# Patient Record
Sex: Female | Born: 1975 | Race: Black or African American | Hispanic: No | Marital: Single | Smoking: Never smoker
Health system: Southern US, Community
[De-identification: ages and names within clinical notes are randomized; demographics above are authoritative.]

## PROBLEM LIST (undated history)

## (undated) DIAGNOSIS — J45909 Unspecified asthma, uncomplicated: Secondary | ICD-10-CM

## (undated) DIAGNOSIS — F419 Anxiety disorder, unspecified: Secondary | ICD-10-CM

## (undated) DIAGNOSIS — R55 Syncope and collapse: Secondary | ICD-10-CM

## (undated) DIAGNOSIS — R519 Headache, unspecified: Secondary | ICD-10-CM

## (undated) DIAGNOSIS — D219 Benign neoplasm of connective and other soft tissue, unspecified: Secondary | ICD-10-CM

## (undated) DIAGNOSIS — D649 Anemia, unspecified: Secondary | ICD-10-CM

## (undated) DIAGNOSIS — R112 Nausea with vomiting, unspecified: Secondary | ICD-10-CM

## (undated) DIAGNOSIS — D75839 Thrombocytosis, unspecified: Secondary | ICD-10-CM

## (undated) DIAGNOSIS — F329 Major depressive disorder, single episode, unspecified: Secondary | ICD-10-CM

## (undated) DIAGNOSIS — D473 Essential (hemorrhagic) thrombocythemia: Secondary | ICD-10-CM

## (undated) DIAGNOSIS — Z9889 Other specified postprocedural states: Secondary | ICD-10-CM

## (undated) DIAGNOSIS — R51 Headache: Secondary | ICD-10-CM

## (undated) DIAGNOSIS — Z9289 Personal history of other medical treatment: Secondary | ICD-10-CM

## (undated) DIAGNOSIS — F32A Depression, unspecified: Secondary | ICD-10-CM

## (undated) DIAGNOSIS — G43909 Migraine, unspecified, not intractable, without status migrainosus: Secondary | ICD-10-CM

## (undated) HISTORY — PX: DILATION AND EVACUATION: SHX1459

---

## 2000-02-18 HISTORY — PX: WISDOM TOOTH EXTRACTION: SHX21

## 2003-03-31 ENCOUNTER — Other Ambulatory Visit: Admission: RE | Admit: 2003-03-31 | Discharge: 2003-03-31 | Payer: Self-pay | Admitting: *Deleted

## 2006-04-28 ENCOUNTER — Ambulatory Visit (HOSPITAL_COMMUNITY): Admission: RE | Admit: 2006-04-28 | Discharge: 2006-04-28 | Payer: Self-pay | Admitting: *Deleted

## 2006-08-28 ENCOUNTER — Inpatient Hospital Stay (HOSPITAL_COMMUNITY): Admission: AD | Admit: 2006-08-28 | Discharge: 2006-08-28 | Payer: Self-pay | Admitting: Obstetrics and Gynecology

## 2006-10-13 ENCOUNTER — Inpatient Hospital Stay (HOSPITAL_COMMUNITY): Admission: AD | Admit: 2006-10-13 | Discharge: 2006-10-13 | Payer: Self-pay | Admitting: Obstetrics and Gynecology

## 2006-10-20 ENCOUNTER — Inpatient Hospital Stay (HOSPITAL_COMMUNITY): Admission: AD | Admit: 2006-10-20 | Discharge: 2006-10-20 | Payer: Self-pay | Admitting: Obstetrics and Gynecology

## 2006-10-23 ENCOUNTER — Ambulatory Visit (HOSPITAL_COMMUNITY): Admission: RE | Admit: 2006-10-23 | Discharge: 2006-10-23 | Payer: Self-pay | Admitting: Obstetrics and Gynecology

## 2006-10-23 IMAGING — US US FETAL BPP W/O NONSTRESS
1 series · 14 of 20 positions shown · non-contrast
Comparison: none

OBSTETRICAL ULTRASOUND:

 This ultrasound exam was performed in the [HOSPITAL] Ultrasound Department.  The OB US report was generated in the AS system, and faxed to the ordering physician.  This report is also available in [REDACTED] PACS.

[Series 1: us fetal bpp w/o nonstress · 0.33mm/px · 14 of 20 slices shown]
[im 1/20]
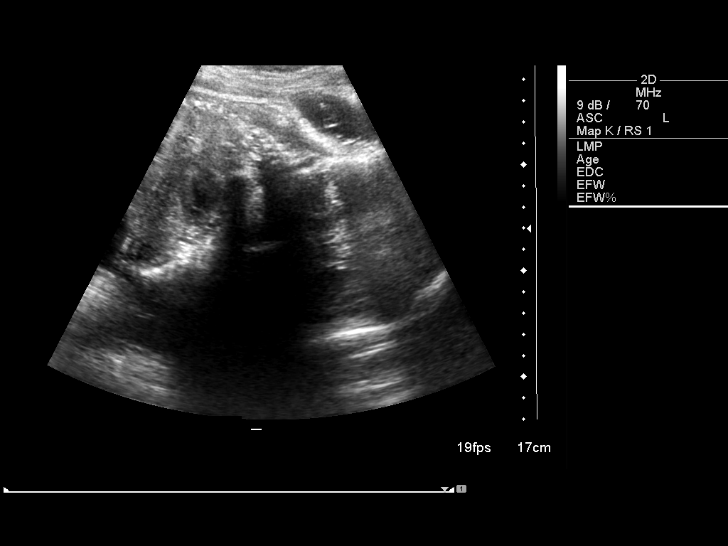
[im 3/20]
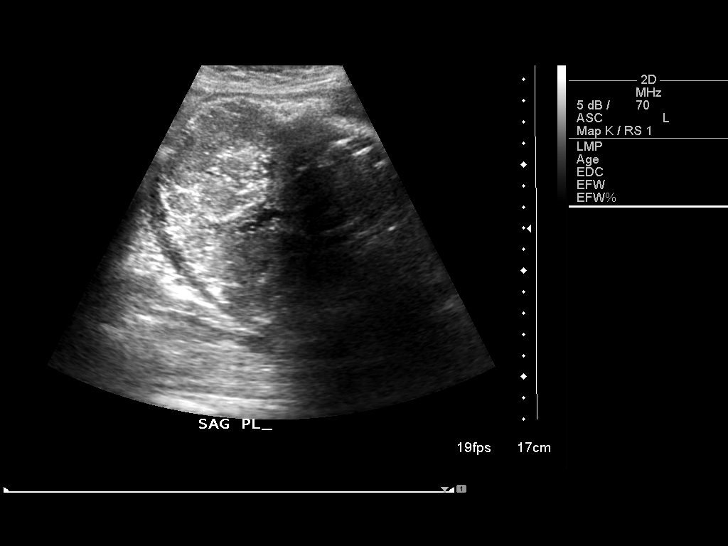
[im 4/20]
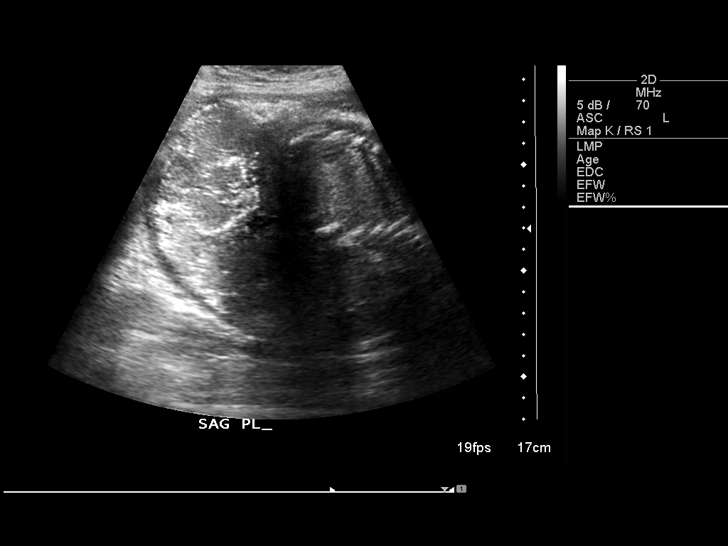
[im 6/20]
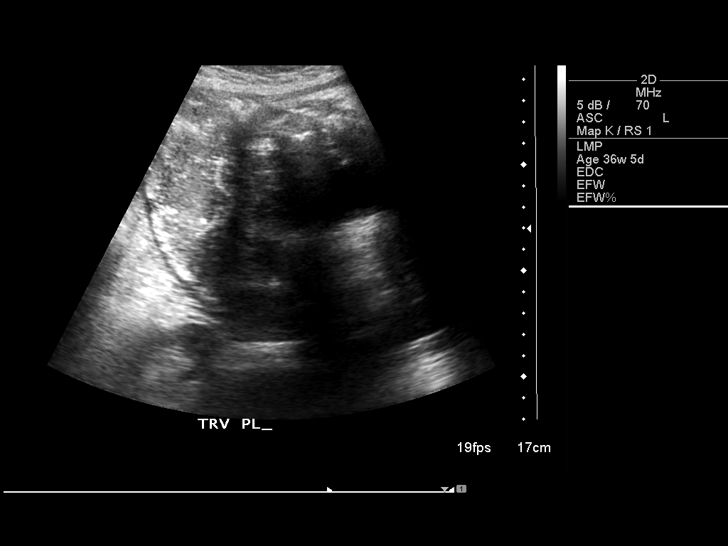
[im 7/20]
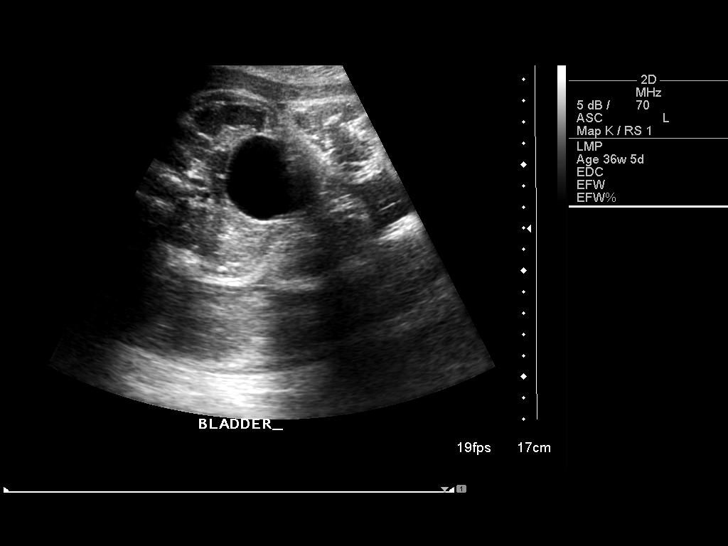
[im 8/20]
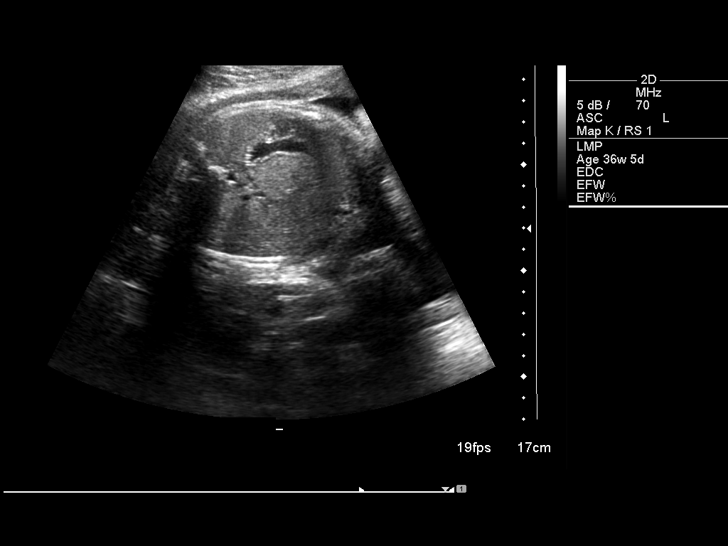
[im 10/20]
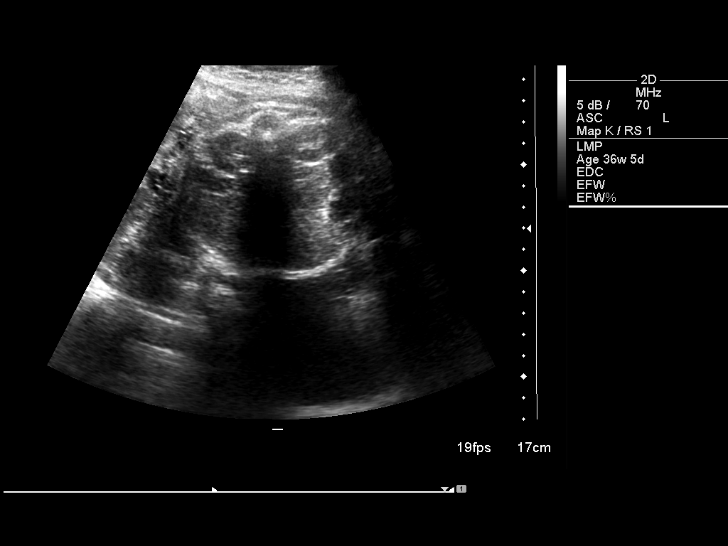
[im 11/20]
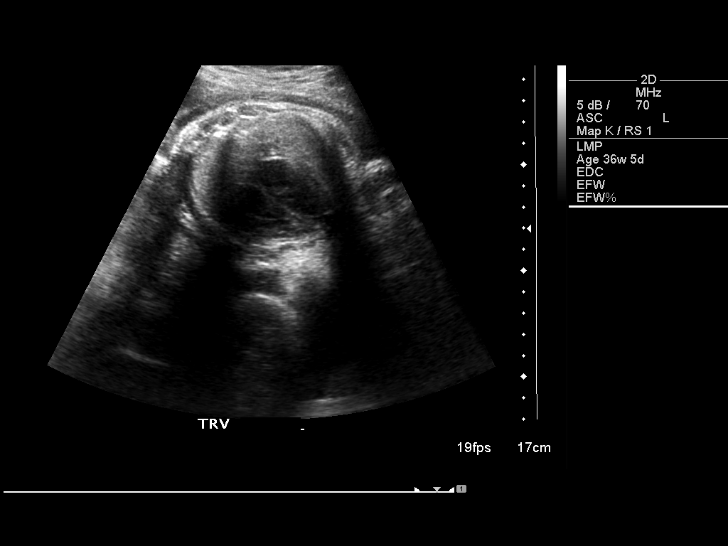
[im 13/20]
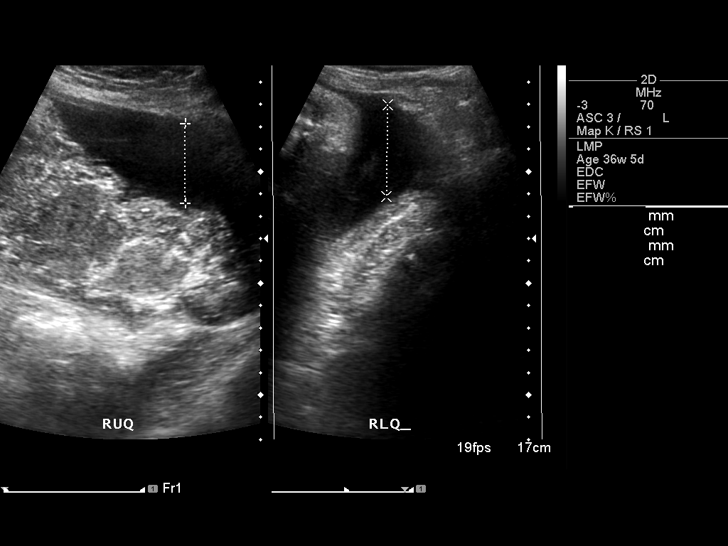
[im 14/20]
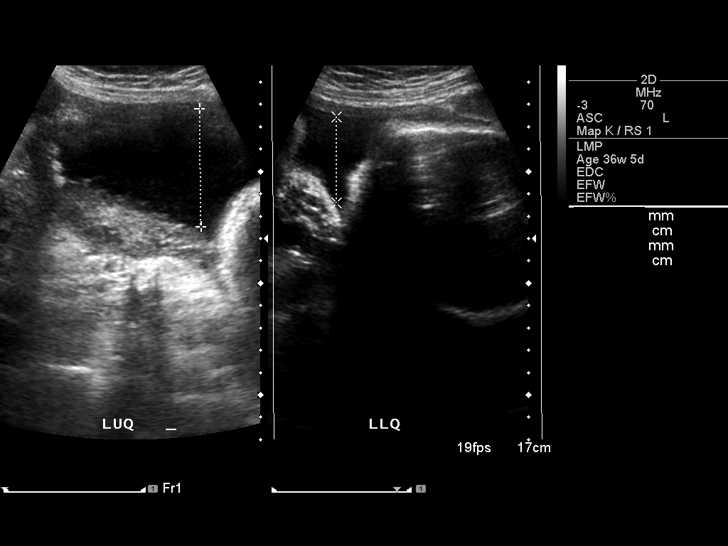
[im 16/20]
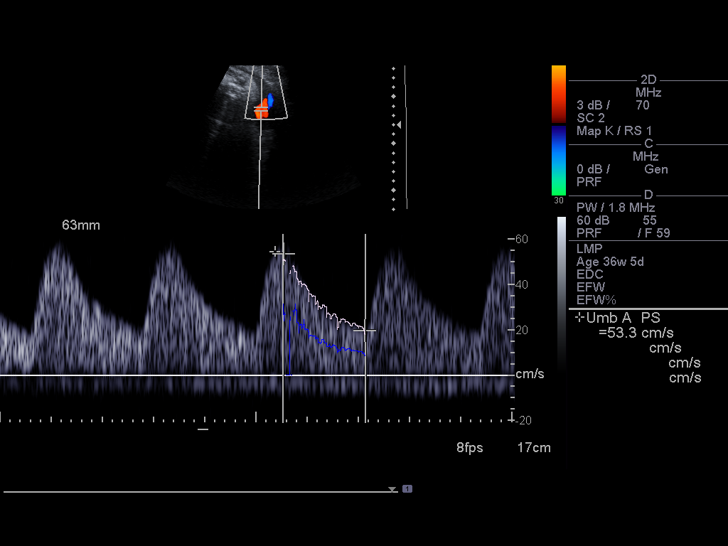
[im 17/20]
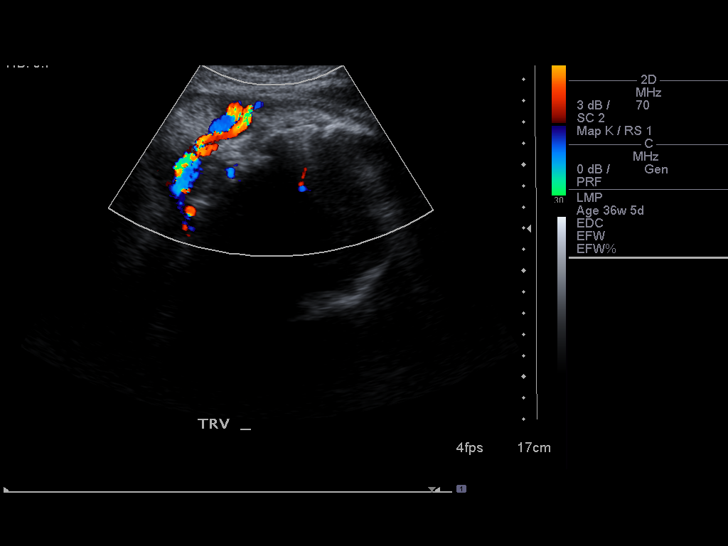
[im 18/20]
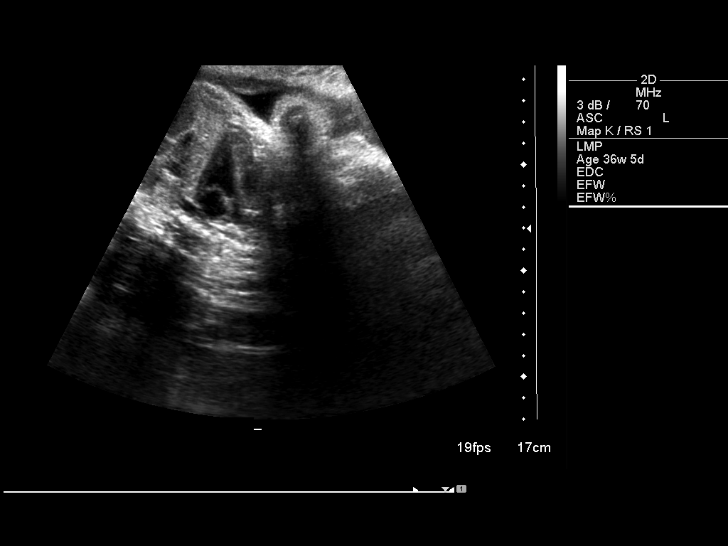
[im 20/20]
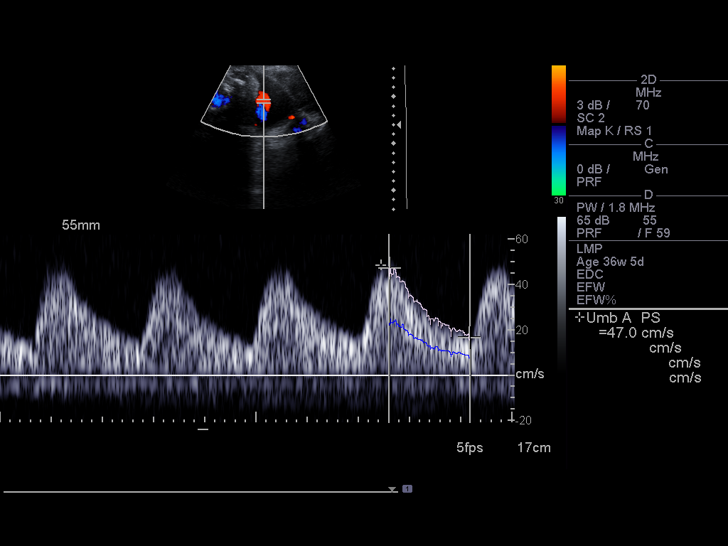

[14 of 20 positions shown; findings below may reference images not displayed]

IMPRESSION: See AS Obstetric US report.

## 2006-10-27 ENCOUNTER — Inpatient Hospital Stay (HOSPITAL_COMMUNITY): Admission: AD | Admit: 2006-10-27 | Discharge: 2006-10-30 | Payer: Self-pay | Admitting: Obstetrics and Gynecology

## 2006-10-27 ENCOUNTER — Ambulatory Visit (HOSPITAL_COMMUNITY): Admission: RE | Admit: 2006-10-27 | Discharge: 2006-10-27 | Payer: Self-pay | Admitting: Obstetrics and Gynecology

## 2010-07-02 NOTE — H&P (Signed)
NAMEMarland Kitchen  Donna Huff, Donna Huff NO.:  [id number]   MEDICAL RECORD NO.:  [id number]          PATIENT TYPE:  INP   LOCATION:  9168                          FACILITY:  [facility]   PHYSICIAN:  Janine Limbo, M.D.DATE OF BIRTH:  20-Sep-1975   DATE OF ADMISSION:  10/27/2006  DATE OF DISCHARGE:                              HISTORY & PHYSICAL   The patient is a 35 year old single black female, gravida 3, para 0-0-2-  0 at 37 weeks, who presents for induction of labor secondary to IUGR.  She had an ultrasound today at [facility] showing estimated fetal  weight less than the 10th percentile at 5 pounds, 2 ounces.  AFI was  normal at 14.5 cm with normal Dopplers.  Her pregnancy has been followed  by [facility] OB[facility]GY[facility] MD service, or initially the CNM service,  then transferred to the MD service and has been remarkable for:  1. Fibroids.  2. First trimester spotting.  3. Asthma.  4. Group B strep negative.  5. Recent abnormal Dopplers that had normalized on 10/23/2006.   Prenatal labs were collected on 11/04/2006.  Hemoglobin 12.1, platelets  282,000.  Blood type A positive, antibody negative.  Sickle cell trait  negative.  RPR non reactive.  Rubella immune.  Hepatitis C surface  antigen negative.  HIV non reactive.  Pap smear within normal limits.  Gonorrhea negative.  Chlamydia negative.  One hour Glucola from  08/19/2006 was 128.   Culture of the vaginal tract for Group B strep, gonorrhea and chlamydia  from 10/16/2006 were all negative.   HISTORY OF PRESENT PREGNANCY:  The patient presented for care at [facility] on 06/04/2006 at 16-2/[redacted] weeks gestation.  Pregnancy  ultrasonography done at 19-2/[redacted] weeks gestation shows growth consistent  with previous dating, confirming EDC of 11/17/2006.  Anterior fibroid  was seen measuring approximately 5 by 2 by 4 cm.  The patient was  treated for UTI at [redacted] weeks gestation.  On 08/28/2006 she had an  ultrasound and  maternity admission showing estimated fetal weight at the  40th percentile, AFI at the 88th percentile, breech presentation.   Ultrasound at [redacted] weeks gestation shows normal AFI with abnormal  Dopplers, growth at the 25th percentile.  Because of the abnormal  Dopplers, plan was for repeat Dopplers and bedrest.  Dopplers were  slightly elevated at 35-1/2 weeks with plan for non stress test once a  week and BPP with Dopplers once a week as well. At 36-1/2 weeks, BPP  was 8 out of 8.  Dopplers were normal.  FD ratio was at the 65th  percentile.  Plan at that point was to do twice weekly NST's and the  patient could come off of bedrest.  Then at the office today the patient  was measuring size less than dates, and ultrasound results from today  are as dictated previously.   PAST OBSTETRICAL HISTORY:  She is gravida 3, para 0-0-2-0, in 15 at [redacted]  weeks gestation she had an elective AB.  In 2001, at 8 to [redacted] weeks  gestation she had an elective AB.  This third pregnancy is a planned  pregnancy.  PAST MEDICAL HISTORY:  She has no medication allergies.  She experienced  menarche at the age of 56 with a 28 day cycle, lasting four days.  She  was diagnosed with fibroids in February 2008.  She had Trichomonas in  2000.  She reports having had the usual childhood illnesses.  The  patient has a history of anemia.  She has had asthma since a baby with  last attack in 1997.  She has had UTI times one.  She has a history of  migraines that do not require medications.   PAST SURGICAL HISTORY:  Remarkable for EAB times two, wisdom teeth  extraction in 2002.   FAMILY HISTORY:  Remarkable for maternal grandmother with heart disease.  Multiple family members with hypertension.  Cousin with varicosities.  Father with anemia.  Multiple family members with asthma and emphysema.  Mother and maternal grandmother with diabetes.  Mother with  fibromyalgia, father with lung cancer.  Maternal uncle with lung  cancer.  Cousin with breast cancer.   GENETIC HISTORY:  The patient's cousin has sickle cell trait.   SOCIAL HISTORY:  The patient is single.  The father of the baby is named  Christiane Ha.  The patient is of the [place] faith.  She has some college  education, is a Press photographer.  The father of the baby has some  college education and is employed in Set [employer].  They denied any  alcohol, tobacco or illicit drug use with the pregnancy.   OBJECTIVE DATA:  Vital signs are stable.  She is afebrile.  HEENT is  grossly within normal limits.  Chest is clear to auscultation.  Heart is  regular rate and rhythm.  Abdomen is gravid in contour with fundal head  extending approximately 35 cm above the pubic symphysis.  Fetal heart  rate is reactive and reassuring.  Contractures are irregular and mild,  every 3 to 8 minutes.  Cervix is fingertip, thick and vertex minus 3.  Extremities are normal.   ASSESSMENT:  1. Intrauterine pregnancy at term.  2. Intrauterine growth retardation.  3. Unfavorable cervix.   PLAN:  1. Admit to a birthing suite.  2. Routine MD orders.  3. Reviewed risks and benefits of induction of labor with the patient.      She agrees to proceed.  4. The plan per Dr. Stefano Gaul, is to use Cytotec tonight, followed by      pitocin in the morning.      Cam Hai, C.N.M.      Janine Limbo, M.D.  Electronically Signed    KS/MEDQ  D:  10/27/2006  T:  10/28/2006  Job:  [id number]

## 2010-11-29 LAB — CBC
HCT: 33.5 — ABNORMAL LOW
Hemoglobin: 11.3 — ABNORMAL LOW
Hemoglobin: 9.3 — ABNORMAL LOW
MCHC: 34.1
MCHC: 34.4
MCHC: 34.5
MCV: 81.4
Platelets: 205
RBC: 3.29 — ABNORMAL LOW
RDW: 14.7 — ABNORMAL HIGH
RDW: 15 — ABNORMAL HIGH

## 2010-11-29 LAB — COMPREHENSIVE METABOLIC PANEL
ALT: 13
Calcium: 8.7
GFR calc Af Amer: >60
Glucose, Bld: 85
Sodium: 134 — ABNORMAL LOW
Total Protein: 5.7 — ABNORMAL LOW

## 2010-11-29 LAB — LACTATE DEHYDROGENASE: LDH: 88 — ABNORMAL LOW

## 2010-12-03 LAB — GC/CHLAMYDIA PROBE AMP, GENITAL
Chlamydia, DNA Probe: NEGATIVE
GC Probe Amp, Genital: NEGATIVE

## 2010-12-03 LAB — URINALYSIS, ROUTINE W REFLEX MICROSCOPIC
Bilirubin Urine: NEGATIVE
Glucose, UA: NEGATIVE
Hgb urine dipstick: NEGATIVE
Ketones, ur: NEGATIVE
Nitrite: NEGATIVE
Protein, ur: NEGATIVE
Specific Gravity, Urine: 1.01
Urobilinogen, UA: 0.2
pH: 6

## 2010-12-03 LAB — URINE MICROSCOPIC-ADD ON: RBC / HPF: NONE SEEN

## 2010-12-03 LAB — STREP B DNA PROBE: Strep Group B Ag: NEGATIVE

## 2010-12-03 LAB — CBC
MCHC: 33
MCV: 83.2
Platelets: 212
RDW: 14.6 — ABNORMAL HIGH

## 2010-12-03 LAB — WET PREP, GENITAL
Clue Cells Wet Prep HPF POC: NONE SEEN
Trich, Wet Prep: NONE SEEN

## 2010-12-03 LAB — DIFFERENTIAL
Basophils Absolute: 0
Basophils Relative: 0
Eosinophils Absolute: 0.1
Neutrophils Relative %: 78 — ABNORMAL HIGH

## 2012-09-17 HISTORY — PX: LAPAROSCOPIC CHOLECYSTECTOMY: SUR755

## 2016-05-26 ENCOUNTER — Observation Stay (HOSPITAL_COMMUNITY)
Admission: EM | Admit: 2016-05-26 | Discharge: 2016-05-27 | Disposition: A | Discharge status: Home / Self Care | Payer: Medicaid Other | Attending: Internal Medicine | Admitting: Internal Medicine

## 2016-05-26 ENCOUNTER — Encounter (HOSPITAL_COMMUNITY): Payer: Self-pay | Admitting: Emergency Medicine

## 2016-05-26 ENCOUNTER — Observation Stay (HOSPITAL_COMMUNITY): Payer: Medicaid Other

## 2016-05-26 DIAGNOSIS — S00212A Abrasion of left eyelid and periocular area, initial encounter: Secondary | ICD-10-CM | POA: Insufficient documentation

## 2016-05-26 DIAGNOSIS — S0181XA Laceration without foreign body of other part of head, initial encounter: Secondary | ICD-10-CM | POA: Insufficient documentation

## 2016-05-26 DIAGNOSIS — R55 Syncope and collapse: Principal | ICD-10-CM | POA: Insufficient documentation

## 2016-05-26 DIAGNOSIS — N920 Excessive and frequent menstruation with regular cycle: Secondary | ICD-10-CM | POA: Diagnosis not present

## 2016-05-26 DIAGNOSIS — D473 Essential (hemorrhagic) thrombocythemia: Secondary | ICD-10-CM | POA: Insufficient documentation

## 2016-05-26 DIAGNOSIS — D509 Iron deficiency anemia, unspecified: Secondary | ICD-10-CM | POA: Insufficient documentation

## 2016-05-26 DIAGNOSIS — D259 Leiomyoma of uterus, unspecified: Secondary | ICD-10-CM | POA: Insufficient documentation

## 2016-05-26 DIAGNOSIS — R4020 Unspecified coma: Secondary | ICD-10-CM | POA: Insufficient documentation

## 2016-05-26 DIAGNOSIS — W1839XA Other fall on same level, initial encounter: Secondary | ICD-10-CM | POA: Insufficient documentation

## 2016-05-26 DIAGNOSIS — Y92002 Bathroom of unspecified non-institutional (private) residence single-family (private) house as the place of occurrence of the external cause: Secondary | ICD-10-CM | POA: Insufficient documentation

## 2016-05-26 DIAGNOSIS — D75839 Thrombocytosis, unspecified: Secondary | ICD-10-CM | POA: Diagnosis present

## 2016-05-26 DIAGNOSIS — D649 Anemia, unspecified: Secondary | ICD-10-CM | POA: Diagnosis not present

## 2016-05-26 DIAGNOSIS — R000 Tachycardia, unspecified: Secondary | ICD-10-CM | POA: Diagnosis not present

## 2016-05-26 DIAGNOSIS — Z9289 Personal history of other medical treatment: Secondary | ICD-10-CM

## 2016-05-26 DIAGNOSIS — Y93E1 Activity, personal bathing and showering: Secondary | ICD-10-CM | POA: Insufficient documentation

## 2016-05-26 DIAGNOSIS — D219 Benign neoplasm of connective and other soft tissue, unspecified: Secondary | ICD-10-CM | POA: Diagnosis present

## 2016-05-26 HISTORY — DX: Major depressive disorder, single episode, unspecified: F32.9

## 2016-05-26 HISTORY — DX: Thrombocytosis, unspecified: D75.839

## 2016-05-26 HISTORY — DX: Other specified postprocedural states: Z98.890

## 2016-05-26 HISTORY — DX: Headache: R51

## 2016-05-26 HISTORY — DX: Migraine, unspecified, not intractable, without status migrainosus: G43.909

## 2016-05-26 HISTORY — DX: Syncope and collapse: R55

## 2016-05-26 HISTORY — DX: Benign neoplasm of connective and other soft tissue, unspecified: D21.9

## 2016-05-26 HISTORY — DX: Unspecified asthma, uncomplicated: J45.909

## 2016-05-26 HISTORY — DX: Anxiety disorder, unspecified: F41.9

## 2016-05-26 HISTORY — DX: Nausea with vomiting, unspecified: R11.2

## 2016-05-26 HISTORY — DX: Depression, unspecified: F32.A

## 2016-05-26 HISTORY — DX: Headache, unspecified: R51.9

## 2016-05-26 HISTORY — DX: Anemia, unspecified: D64.9

## 2016-05-26 HISTORY — DX: Personal history of other medical treatment: Z92.89

## 2016-05-26 HISTORY — DX: Essential (hemorrhagic) thrombocythemia: D47.3

## 2016-05-26 LAB — I-STAT BETA HCG BLOOD, ED (MC, WL, AP ONLY): I-stat hCG, quantitative: <5 m[IU]/mL (ref <5)

## 2016-05-26 LAB — URINALYSIS, ROUTINE W REFLEX MICROSCOPIC
BILIRUBIN URINE: NEGATIVE
Bacteria, UA: NONE SEEN
Glucose, UA: NEGATIVE mg/dL
KETONES UR: 5 mg/dL — AB
Leukocytes, UA: NEGATIVE
Nitrite: NEGATIVE
PH: 5 (ref 5.0–8.0)
Protein, ur: NEGATIVE mg/dL
SPECIFIC GRAVITY, URINE: 1.014 (ref 1.005–1.030)

## 2016-05-26 LAB — CBC
HCT: 24.5 % — ABNORMAL LOW (ref 36.0–46.0)
HCT: 25.3 % — ABNORMAL LOW (ref 36.0–46.0)
Hemoglobin: 6.9 g/dL — CL (ref 12.0–15.0)
Hemoglobin: 7.3 g/dL — ABNORMAL LOW (ref 12.0–15.0)
MCH: 17.5 pg — ABNORMAL LOW (ref 26.0–34.0)
MCH: 18.9 pg — ABNORMAL LOW (ref 26.0–34.0)
MCHC: 28.2 g/dL — ABNORMAL LOW (ref 30.0–36.0)
MCHC: 28.9 g/dL — AB (ref 30.0–36.0)
MCV: 62 fL — ABNORMAL LOW (ref 78.0–100.0)
MCV: 65.5 fL — ABNORMAL LOW (ref 78.0–100.0)
Platelets: 532 10*3/uL — ABNORMAL HIGH (ref 150–400)
Platelets: 469 10*3/uL — ABNORMAL HIGH (ref 150–400)
RBC: 3.95 MIL/uL (ref 3.87–5.11)
RBC: 3.86 MIL/uL — ABNORMAL LOW (ref 3.87–5.11)
RDW: 19.8 % — ABNORMAL HIGH (ref 11.5–15.5)
RDW: 22.5 % — AB (ref 11.5–15.5)
WBC: 9.5 10*3/uL (ref 4.0–10.5)
WBC: 8.5 10*3/uL (ref 4.0–10.5)

## 2016-05-26 LAB — PREPARE RBC (CROSSMATCH)

## 2016-05-26 LAB — IRON AND TIBC
Iron: 9 ug/dL — ABNORMAL LOW (ref 28–170)
SATURATION RATIOS: 2 % — AB (ref 10.4–31.8)
TIBC: 487 ug/dL — ABNORMAL HIGH (ref 250–450)
UIBC: 478 ug/dL

## 2016-05-26 LAB — FOLATE: FOLATE: 7.7 ng/mL (ref >5.9)

## 2016-05-26 LAB — BASIC METABOLIC PANEL
Anion gap: 7 (ref 5–15)
BUN: 9 mg/dL (ref 6–20)
CO2: 25 mmol/L (ref 22–32)
Calcium: 9.4 mg/dL (ref 8.9–10.3)
Chloride: 103 mmol/L (ref 101–111)
Creatinine, Ser: 0.74 mg/dL (ref 0.44–1.00)
GFR calc Af Amer: >60 mL/min (ref >60)
GFR calc non Af Amer: >60 mL/min (ref >60)
Glucose, Bld: 85 mg/dL (ref 65–99)
Potassium: 3.6 mmol/L (ref 3.5–5.1)
Sodium: 135 mmol/L (ref 135–145)

## 2016-05-26 LAB — FERRITIN: FERRITIN: 2 ng/mL — AB (ref 11–307)

## 2016-05-26 LAB — VITAMIN B12: Vitamin B-12: 451 pg/mL (ref 180–914)

## 2016-05-26 LAB — ABO/RH: ABO/RH(D): A POS

## 2016-05-26 LAB — RETICULOCYTES
RBC.: 3.89 MIL/uL (ref 3.87–5.11)
RETIC CT PCT: 1.2 % (ref 0.4–3.1)
Retic Count, Absolute: 46.7 10*3/uL (ref 19.0–186.0)

## 2016-05-26 LAB — I-STAT TROPONIN, ED: Troponin i, poc: 0 ng/mL (ref 0.00–0.08)

## 2016-05-26 LAB — TSH: TSH: 1.726 u[IU]/mL (ref 0.350–4.500)

## 2016-05-26 MED ORDER — HYDROCODONE-ACETAMINOPHEN 5-325 MG PO TABS
1.0000 | ORAL_TABLET | ORAL | Status: DC | PRN
Start: 2016-05-26 — End: 2016-05-27

## 2016-05-26 MED ORDER — TETANUS-DIPHTH-ACELL PERTUSSIS 5-2.5-18.5 LF-MCG/0.5 IM SUSP
0.5000 mL | Freq: Once | INTRAMUSCULAR | Status: AC
  Administered 2016-05-26: 0.5 mL via INTRAMUSCULAR
  Filled 2016-05-26: qty 0.5

## 2016-05-26 MED ORDER — LIDOCAINE HCL 2 % EX GEL
1.0000 "application " | Freq: Four times a day (QID) | CUTANEOUS | Status: DC | PRN
  Filled 2016-05-26: qty 5

## 2016-05-26 MED ORDER — DICLOFENAC SODIUM 1 % TD GEL
4.0000 g | Freq: Four times a day (QID) | TRANSDERMAL | Status: DC | PRN
  Administered 2016-05-26 (×2): 4 g via TOPICAL
  Filled 2016-05-26: qty 100

## 2016-05-26 MED ORDER — SODIUM CHLORIDE 0.9% FLUSH
3.0000 mL | Freq: Two times a day (BID) | INTRAVENOUS | Status: DC
  Administered 2016-05-26 – 2016-05-27 (×2): 3 mL via INTRAVENOUS

## 2016-05-26 MED ORDER — ENOXAPARIN SODIUM 40 MG/0.4ML ~~LOC~~ SOLN
40.0000 mg | SUBCUTANEOUS | Status: DC
Start: 2016-05-26 — End: 2016-05-26

## 2016-05-26 MED ORDER — ACETAMINOPHEN 325 MG PO TABS: 650.0000 mg | ORAL_TABLET | Freq: Four times a day (QID) | ORAL | Status: DC | PRN

## 2016-05-26 MED ORDER — ONDANSETRON HCL 4 MG/2ML IJ SOLN: 4.0000 mg | Freq: Four times a day (QID) | INTRAMUSCULAR | Status: DC | PRN

## 2016-05-26 MED ORDER — TRAMADOL HCL 50 MG PO TABS
50.0000 mg | ORAL_TABLET | Freq: Four times a day (QID) | ORAL | Status: DC | PRN
  Administered 2016-05-27: 50 mg via ORAL
  Filled 2016-05-26: qty 1

## 2016-05-26 MED ORDER — SODIUM CHLORIDE 0.9 % IV SOLN
10.0000 mL/h | Freq: Once | INTRAVENOUS | Status: AC
  Administered 2016-05-26: 10 mL/h via INTRAVENOUS

## 2016-05-26 MED ORDER — ONDANSETRON HCL 4 MG PO TABS: 4.0000 mg | ORAL_TABLET | Freq: Four times a day (QID) | ORAL | Status: DC | PRN

## 2016-05-26 MED ORDER — SENNOSIDES-DOCUSATE SODIUM 8.6-50 MG PO TABS: 1.0000 | ORAL_TABLET | Freq: Every evening | ORAL | Status: DC | PRN

## 2016-05-26 MED ORDER — ACETAMINOPHEN 650 MG RE SUPP: 650.0000 mg | Freq: Four times a day (QID) | RECTAL | Status: DC | PRN

## 2016-05-26 MED ORDER — SODIUM CHLORIDE 0.9 % IV SOLN
INTRAVENOUS | Status: AC
  Administered 2016-05-26: 17:00:00 via INTRAVENOUS

## 2016-05-26 NOTE — Discharge Planning (Signed)
EDCM contacted to assist with follow-up appointment.  Pt has Medicaid Insurance with an assigned PCP ([facility] as PCP in [place], [place]). Pt must contact DSS to have PCP changed in order to be seen by another MD.    Donna Huff J. Clydene Laming, [place], [place], [place]

## 2016-05-26 NOTE — ED Notes (Addendum)
Pt ambulated to restroom. Urine sample cup/wipe provided

## 2016-05-26 NOTE — ED Triage Notes (Signed)
TC from Lab HGB 6.8. Report to EDP [facility]

## 2016-05-26 NOTE — ED Notes (Signed)
Patient transported to X-ray 

## 2016-05-26 NOTE — Progress Notes (Signed)
Patient trasfered from ED to [phone] via stretcher; alert and oriented x 4; complaints of pain on left eyebrow; IV in LAC  running blood @125cc /hr. Orient patient to room and unit;  gave patient care guide; instructed how to use the call bell and  fall risk precautions. Will continue to monitor the patient.

## 2016-05-26 NOTE — ED Triage Notes (Signed)
Pt states she wok,e up feeling like her heart was racing. Pt took a shower, got out and passed out into the hallway. Pt lost consciousness and hit head.  Pt has abrasion over left eyebrow and swelling. Pt alert and oriented at triage.

## 2016-05-26 NOTE — ED Provider Notes (Signed)
[place] DEPT Provider Note   CSN: [id number] Arrival date & time: 05/26/16  0946     History   Chief Complaint Chief Complaint  Patient presents with  . Loss of Consciousness    HPI Donna Huff is a 41 y.o. female with a PMHx of fibroids, migraines, and anemia, who presents to the ED with complaints of loss of consciousness around 8 AM. Patient states that this morning around 6:45 AM she woke up having some palpitations, which subsided after a little bit. She got up and went to take a shower; while she was in the hot shower she began to feel lightheaded, so she turned the shower off and took a step out of the tub at which point she lost consciousness. She states that she isn't sure how long she was out, but when she awoke she noticed her left eyebrow had been cut and she thinks that she hit the wall when she fell. The bleeding has stopped. The only known aggravating factor was the hot shower, no treatments tried or alleviating factors noted, and she no longer feels lightheaded or palpitations at this time. She mentions that she has had heavy menstrual cycles for the last 10 years since the birth of her daughter and 10/2006, chart review from the OB/GYN H&P from that birth reveals that she was diagnosed with fibroids in February 2008, however patient was not aware of this diagnosis. She states that she is currently on her menstrual cycle, which started on 05/21/16, and reports that this menstrual cycle is actually less heavy than her typical, but states she usually has fairly heavy menstrual cycles. She reports that she does have clots passed with her menstrual cycles which is normal, and reports that she typically goes through one pad or tampon every 2-3 hours. She does not currently have an OB/GYN. Her PCP is at [facility] family practice. Last Tdap was 61yrs ago.   She denies vision changes, headaches, fevers, chills, CP, SOB, abd pain, N/V/D/C, hematochezia, melena, hematuria, dysuria,  vaginal discharge, myalgias, arthralgias, numbness, tingling, focal weakness, or any other complaints at this time.    The history is provided by the patient and medical records. No language interpreter was used.  Loss of Consciousness   This is a new problem. The current episode started 3 to 5 hours ago. The problem occurs rarely. The problem has been resolved. Length of episode of loss of consciousness: unsure how long. Associated with: hot shower. Associated symptoms include light-headedness and palpitations. Pertinent negatives include abdominal pain, chest pain, confusion, fever, focal sensory loss, focal weakness, headaches, nausea, visual change, vomiting and weakness. She has tried nothing for the symptoms. The treatment provided no relief.    History reviewed. No pertinent past medical history.  There are no active problems to display for this patient.   History reviewed. No pertinent surgical history.  OB History    No data available       Home Medications    Prior to Admission medications   Not on File    Family History History reviewed. No pertinent family history.  Social History Social History  Substance Use Topics  . Smoking status: Never Smoker  . Smokeless tobacco: Never Used  . Alcohol use No     Allergies   Patient has no known allergies.   Review of Systems Review of Systems  Constitutional: Negative for chills and fever.  Eyes: Negative for visual disturbance.  Respiratory: Negative for shortness of breath.   Cardiovascular:  Positive for palpitations and syncope. Negative for chest pain.  Gastrointestinal: Negative for abdominal pain, blood in stool, constipation, diarrhea, nausea and vomiting.  Genitourinary: Positive for vaginal bleeding (on menses). Negative for dysuria, hematuria and vaginal discharge.  Musculoskeletal: Negative for arthralgias and myalgias.  Skin: Positive for wound. Negative for color change.  Allergic/Immunologic:  Negative for immunocompromised state.  Neurological: Positive for syncope and light-headedness. Negative for focal weakness, weakness, numbness and headaches.  Hematological: Does not bruise/bleed easily.  Psychiatric/Behavioral: Negative for confusion.   10 Systems reviewed and are negative for acute change except as noted in the HPI.   Physical Exam Updated Vital Signs BP 123/88 (BP Location: Right Arm)   Pulse 91   Temp 97.7 F (36.5 C) (Oral)   Resp 14   Ht 5\' 3"  (1.6 m)   Wt 55.3 kg   LMP 05/21/2016   SpO2 100%   BMI 21.61 kg/m   Physical Exam  Constitutional: She is oriented to person, place, and time. Vital signs are normal. She appears well-developed and well-nourished.  Non-toxic appearance. No distress.  Afebrile, nontoxic, NAD  HENT:  Head: Normocephalic. Head is with abrasion. Head is without raccoon's eyes, without Battle's sign and without contusion.  Mouth/Throat: Oropharynx is clear and moist and mucous membranes are normal.  Small linear abrasion/superficial laceration to L eyebrow, minimal swelling, no ongoing bleeding; already dried over and doesn't seem to be deep vs already healing/scabbed. No scalp tenderness, crepitus, or deformity. No racoon eyes or battle's sign. No s/sx of basilar skull fx  Eyes: Conjunctivae and EOM are normal. Pupils are equal, round, and reactive to light. Right eye exhibits no discharge. Left eye exhibits no discharge.  +Conjunctival pallor PERRL, EOMI, no nystagmus, no visual field deficits   Neck: Normal range of motion. Neck supple. No spinous process tenderness and no muscular tenderness present. No neck rigidity. Normal range of motion present.  FROM intact without spinous process TTP, no bony stepoffs or deformities, no paraspinous muscle TTP or muscle spasms. No rigidity or meningeal signs. No bruising or swelling.   Cardiovascular: Normal rate, regular rhythm, normal heart sounds and intact distal pulses.  Exam reveals no gallop  and no friction rub.   No murmur heard. Pulmonary/Chest: Effort normal and breath sounds normal. No respiratory distress. She has no decreased breath sounds. She has no wheezes. She has no rhonchi. She has no rales.  Abdominal: Soft. Normal appearance and bowel sounds are normal. She exhibits no distension. There is no tenderness. There is no rigidity, no rebound, no guarding, no CVA tenderness, no tenderness at McBurney's point and negative Murphy's sign.  Soft, NTND, +BS throughout, no r/g/r, neg murphy's, neg mcburney's, no CVA TTP   Musculoskeletal: Normal range of motion.  MAE x4 Strength and sensation grossly intact in all extremities Distal pulses intact Gait steady  Neurological: She is alert and oriented to person, place, and time. She has normal strength. No cranial nerve deficit or sensory deficit. Coordination and gait normal. GCS eye subscore is 4. GCS verbal subscore is 5. GCS motor subscore is 6.  CN 2-12 grossly intact A&O x4 GCS 15 Sensation and strength intact Gait nonataxic including with tandem walking Coordination with finger-to-nose WNL Neg pronator drift   Skin: Skin is warm and dry. Abrasion noted. No rash noted.  L eyebrow abrasion as mentioned above  Psychiatric: She has a normal mood and affect.  Nursing note and vitals reviewed.    ED Treatments / Results  Labs (all  labs ordered are listed, but only abnormal results are displayed) Labs Reviewed  CBC - Abnormal; Notable for the following:       Result Value   Hemoglobin 6.9 (*)    HCT 24.5 (*)    MCV 62.0 (*)    MCH 17.5 (*)    MCHC 28.2 (*)    RDW 19.8 (*)    Platelets 532 (*)    All other components within normal limits  BASIC METABOLIC PANEL  URINALYSIS, ROUTINE W REFLEX MICROSCOPIC  VITAMIN B12  FOLATE  IRON AND TIBC  FERRITIN  RETICULOCYTES  I-STAT BETA HCG BLOOD, ED ([facility], [facility], [facility] ONLY)  I-STAT TROPOININ, ED  TYPE AND SCREEN  PREPARE RBC (CROSSMATCH)  ABO/RH    EKG  EKG  Interpretation  Date/Time:  Monday May 26 2016 10:06:04 EDT Ventricular Rate:  88 PR Interval:  126 QRS Duration: 88 QT Interval:  360 QTC Calculation: 435 R Axis:   69 Text Interpretation:  Normal sinus rhythm Minimal voltage criteria for LVH, may be normal variant Nonspecific T wave abnormality Abnormal ECG No old tracing to compare Confirmed by KOHUT  MD, STEPHEN [phone]) on 05/26/2016 11:14:41 AM       Radiology No results found.  Procedures Procedures (including critical care time)  CRITICAL CARE- symptomatic anemia requiring transfusion Performed by: Reece Agar   Total critical care time: 45 minutes  Critical care time was exclusive of separately billable procedures and treating other patients.  Critical care was necessary to treat or prevent imminent or life-threatening deterioration.  Critical care was time spent personally by me on the following activities: development of treatment plan with patient and/or surrogate as well as nursing, discussions with consultants, evaluation of patient's response to treatment, examination of patient, obtaining history from patient or surrogate, ordering and performing treatments and interventions, ordering and review of laboratory studies, ordering and review of radiographic studies, pulse oximetry and re-evaluation of patient's condition.   Medications Ordered in ED Medications  0.9 %  sodium chloride infusion (not administered)  Tdap (BOOSTRIX) injection 0.5 mL (not administered)     Initial Impression / Assessment and Plan / ED Course  I have reviewed the triage vital signs and the nursing notes.  Pertinent labs & imaging results that were available during my care of the patient were reviewed by me and considered in my medical decision making (see chart for details).     41 y.o. female here with LOC after taking a hot shower, fell in bathroom, hit L eyebrow on the wall. Reports she's on her menstrual cycle, which is  actually lighter than usual, but states she has fairly heavy menses for the last 10 years. Hx of fibroids. On exam, conjunctival pallor noted, small linear abrasion to L eyebrow. No scalp crepitus, tenderness, or deformity. No focal neuro deficits. No abdominal tenderness. Labs reveal: CBC with hgb 6.9, very microcyctic likely from her heavy menstrual cycles; plt 532, unclear etiology for this. BMP WNL. EKG with no acute ischemic findings, some nonspecific T wave changes, no prior to compare to. Will update Tdap, however abrasion doesn't seem to need any further repair/intervention. Doubt need for head imaging. Will get anemia panel, troponin, betaHCG, and await U/A, and proceed with admission for transfusion for symptomatic anemia. Discussed case with my attending Dr. Wilson Singer who agrees with plan. Pt agrees with plan. Declines needing anything at this time. Will proceed with admission and transfusion.   12:05 PM Remainder of work up still pending. Dyanne Carrel  NP of [facility] returning page and will admit. Holding orders to be placed by admitting team. Please see their notes for further documentation of care. I appreciate their help with this pleasant pt's care. Pt stable at time of admission.    Final Clinical Impressions(s) / ED Diagnoses   Final diagnoses:  LOC (loss of consciousness) ([place])  Symptomatic anemia  Menorrhagia with regular cycle  Abrasion of left eyebrow, initial encounter  Thrombocytosis [facility])    New Prescriptions New Prescriptions   No medications on file       [address] PA-C 05/26/16 [place], MD 06/03/16 [phone]

## 2016-05-26 NOTE — H&P (Signed)
History and Physical    Donna Huff IEP:[id number] DOB: [birth year] DOA: 05/26/2016  PCP: [facility] Patient coming from: home  Chief Complaint: syncope and collapse  HPI: Donna Huff is a very pleasant 41 y.o. female with medical history significant for uterine fibroids, anemia, migraines presents to emergency Department chief complaint of loss of consciousness. Initial evaluation reveals anemia likely related to blood loss and thrombocytosis  Information is obtained from the patient. She states over the last couple of weeks she has felt more tired than usual. This morning she awakened and she noticed she was having palpitations. She sat still for minute this subsided. She reports she has had intermittent episodes of the same over the last several weeks. She got up to go in the shower and had another episode of palpitations. Associated symptoms include lightheadedness and dizziness. She stepped out of the shower next memory is waking up on the floor. She noted there was bleeding from above her left eye. He denies having a difficulty getting up off the floor. She denies headache visual disturbances numbness tingling of extremities. She denies chest pain shortness of breath nausea vomiting diarrhea. She denies abdominal pain nausea vomiting diarrhea. She denies any constipation melena bright red blood per rectum. She denies dysuria hematuria frequency or urgency. She denies fever chills recent travel or sick contacts. She does report she has very have periods and has for many years. She states that she has only 1 period per month in the last 5-7 days. She is unaware that she's ever been diagnosed with uterine fibroids.    ED Course: Emergency department she's afebrile hemodynamically stable and not hypoxic. She is provided with gentle IV fluids 2 units packed red blood cells were ordered  Review of Systems: As per HPI otherwise 10 point review of  systems negative.   Ambulatory Status: Ambulates independently and independent with ADLs  Past Medical History:  Diagnosis Date  . Anemia   . Fibroids   . Syncope and collapse   . Thrombocytosis ([place])     History reviewed. No pertinent surgical history.  Social History   Social History  . Marital status: Single    Spouse name: N/A  . Number of children: N/A  . Years of education: N/A   Occupational History  . Not on file.   Social History Main Topics  . Smoking status: Never Smoker  . Smokeless tobacco: Never Used  . Alcohol use No  . Drug use: No  . Sexual activity: Not on file   Other Topics Concern  . Not on file   Social History Narrative  . No narrative on file    No Known Allergies  Family History  Problem Relation Age of Onset  . Hypertension Mother     Prior to Admission medications   Not on File    Physical Exam: Vitals:   05/26/16 1000 05/26/16 1001  BP: 123/88   Pulse: 91   Resp: 14   Temp: 97.7 F (36.5 C)   TempSrc: Oral   SpO2: 100%   Weight:  55.3 kg (122 lb)  Height:  5\' 3"  (1.6 m)     General:  Appears calm and comfortable sitting on side of the bed Eyes:  PERRL, EOMI, normal lids, iris small laceration above left eyebrow. Some swelling as well. No erythema ENT:  grossly normal hearing, lips & tongue, mucus membranes of her mouth are moist and pink Neck:  no LAD, masses or  thyromegaly Cardiovascular:  RRR, no m/r/g. No LE edema. Pedal pulses present and palpable Respiratory:  CTA bilaterally, no w/r/r. Normal respiratory effort. Abdomen:  soft, ntnd, positive bowel sounds throughout no guarding or rebounding Skin:  no rash or induration seen on limited exam Musculoskeletal:  grossly normal tone BUE/BLE, good ROM, no bony abnormality Psychiatric:  grossly normal mood and affect, speech fluent and appropriate, AOx3 Neurologic:  CN 2-12 grossly intact, moves all extremities in coordinated fashion, sensation intact  Labs on  Admission: I have personally reviewed following labs and imaging studies  CBC:  Recent Labs Lab 05/26/16 1012  WBC 9.5  HGB 6.9*  HCT 24.5*  MCV 62.0*  PLT 841*   Basic Metabolic Panel:  Recent Labs Lab 05/26/16 1012  NA 135  K 3.6  CL 103  CO2 25  GLUCOSE 85  BUN 9  CREATININE 0.74  CALCIUM 9.4   GFR: Estimated Creatinine Clearance: 75.8 mL/min (by C-G formula based on SCr of 0.74 mg/dL). Liver Function Tests: No results for input(s): AST, ALT, ALKPHOS, BILITOT, PROT, ALBUMIN in the last 168 hours. No results for input(s): LIPASE, AMYLASE in the last 168 hours. No results for input(s): AMMONIA in the last 168 hours. Coagulation Profile: No results for input(s): INR, PROTIME in the last 168 hours. Cardiac Enzymes: No results for input(s): CKTOTAL, CKMB, CKMBINDEX, TROPONINI in the last 168 hours. BNP (last 3 results) No results for input(s): PROBNP in the last 8760 hours. HbA1C: No results for input(s): HGBA1C in the last 72 hours. CBG: No results for input(s): GLUCAP in the last 168 hours. Lipid Profile: No results for input(s): CHOL, HDL, LDLCALC, TRIG, CHOLHDL, LDLDIRECT in the last 72 hours. Thyroid Function Tests: No results for input(s): TSH, T4TOTAL, FREET4, T3FREE, THYROIDAB in the last 72 hours. Anemia Panel: No results for input(s): VITAMINB12, FOLATE, FERRITIN, TIBC, IRON, RETICCTPCT in the last 72 hours. Urine analysis:    Component Value Date/Time   COLORURINE STRAW (A) 08/28/2006 0315   APPEARANCEUR CLEAR 08/28/2006 0315   LABSPEC 1.010 08/28/2006 0315   PHURINE 6.0 08/28/2006 0315   GLUCOSEU NEGATIVE 08/28/2006 0315   HGBUR NEGATIVE 08/28/2006 0315   BILIRUBINUR NEGATIVE 08/28/2006 0315   KETONESUR NEGATIVE 08/28/2006 0315   PROTEINUR NEGATIVE 08/28/2006 0315   UROBILINOGEN 0.2 08/28/2006 0315   NITRITE NEGATIVE 08/28/2006 0315   LEUKOCYTESUR LARGE (A) 08/28/2006 0315    Creatinine Clearance: Estimated Creatinine Clearance: 75.8 mL/min  (by C-G formula based on SCr of 0.74 mg/dL).  Sepsis Labs: @LABRCNTIP (procalcitonin:4,lacticidven:4) )No results found for this or any previous visit (from the past 240 hour(s)).   Radiological Exams on Admission: X-ray Chest Pa And Lateral  Result Date: 05/26/2016 CLINICAL DATA:  Syncope, collapse EXAM: CHEST  2 VIEW COMPARISON:  None. FINDINGS: Heart and mediastinal contours are within normal limits. No focal opacities or effusions. No acute bony abnormality. IMPRESSION: No active cardiopulmonary disease. Electronically Signed   By: Rolm Baptise M.D.   On: 05/26/2016 12:38    EKG: Independently reviewed. Normal sinus rhythm Minimal voltage criteria for LVH, may be normal variant Nonspecific T wave abnormality Abnormal ECG   Assessment/Plan Principal Problem:   Syncope and collapse Active Problems:   Fibroids   Anemia   Thrombocytosis (HCC)   Facial laceration   #1. Syncope and collapse. Likely related to anemia in setting of heavy menses. Patient with a history of uterine fibroids. Hemoglobin 6.9. Mild tachycardia. EKG with NSR. She is afebrile hemodynamically stable and nontoxic appearing. No home medications.  No metabolic derangements. Neuro exam benign -Admit to telemetry - obtain urinalysis and chest x-ray -obtain TSH -FOBT -Echocardiogram -We'll likely need outpatient follow-up  #2. Anemia likely related to blood loss secondary to heavy menses. 6.9 on admission  -Anemia panel -fobt -Transfuse 2 units of packed red blood cells -serial cbc -monitor  #3.thrombocytosis. Likely related to above -monitor -OP follow up  #4. Uterine fibroids. Reports heavy flow with menstruation. Uses 1 pad every 2-3 hours. -OP follow up.  -case management for assistance with establishing with local PCP  #5. Facial laceration. Related to #1. Above left eyebrow. Related to fall -no bleeding -ice -supportive      DVT prophylaxis: scd Code Status: full  Family Communication: none  present  Disposition Plan: home  Consults called: none  Admission status: obs    Dyanne Carrel M MD [facility]  If 7PM-7AM, please contact night-coverage [email or web address] Password [facility]  05/26/2016, 12:47 PM

## 2016-05-27 ENCOUNTER — Observation Stay (HOSPITAL_COMMUNITY): Admit: 2016-05-27 | Payer: Medicaid Other

## 2016-05-27 DIAGNOSIS — N920 Excessive and frequent menstruation with regular cycle: Secondary | ICD-10-CM

## 2016-05-27 DIAGNOSIS — R55 Syncope and collapse: Secondary | ICD-10-CM | POA: Diagnosis not present

## 2016-05-27 DIAGNOSIS — D500 Iron deficiency anemia secondary to blood loss (chronic): Secondary | ICD-10-CM

## 2016-05-27 DIAGNOSIS — S00212A Abrasion of left eyelid and periocular area, initial encounter: Secondary | ICD-10-CM | POA: Diagnosis not present

## 2016-05-27 LAB — TYPE AND SCREEN
ABO/RH(D): A POS
Antibody Screen: NEGATIVE
UNIT DIVISION: 0
Unit division: 0

## 2016-05-27 LAB — GLUCOSE, CAPILLARY: GLUCOSE-CAPILLARY: 76 mg/dL (ref 65–99)

## 2016-05-27 LAB — CBC
HCT: 29.5 % — ABNORMAL LOW (ref 36.0–46.0)
Hemoglobin: 8.8 g/dL — ABNORMAL LOW (ref 12.0–15.0)
MCH: 20.3 pg — ABNORMAL LOW (ref 26.0–34.0)
MCHC: 29.8 g/dL — AB (ref 30.0–36.0)
MCV: 68.1 fL — ABNORMAL LOW (ref 78.0–100.0)
PLATELETS: 454 10*3/uL — AB (ref 150–400)
RBC: 4.33 MIL/uL (ref 3.87–5.11)
RDW: 23.1 % — AB (ref 11.5–15.5)
WBC: 7.3 10*3/uL (ref 4.0–10.5)

## 2016-05-27 LAB — BPAM RBC
Blood Product Expiration Date: 201804252359
Blood Product Expiration Date: 201804272359
ISSUE DATE / TIME: 201804092131
ISSUE DATE / TIME: 201804091604
UNIT TYPE AND RH: 6200
Unit Type and Rh: 6200

## 2016-05-27 LAB — HIV ANTIBODY (ROUTINE TESTING W REFLEX): HIV Screen 4th Generation wRfx: NONREACTIVE

## 2016-05-27 MED ORDER — ACETAMINOPHEN 500 MG PO TABS: 500.0000 mg | ORAL_TABLET | Freq: Four times a day (QID) | ORAL | 0 refills | Status: AC | PRN

## 2016-05-27 MED ORDER — PANTOPRAZOLE SODIUM 40 MG PO TBEC: 40.0000 mg | DELAYED_RELEASE_TABLET | Freq: Every day | ORAL | 1 refills | Status: AC

## 2016-05-27 MED ORDER — FERROUS SULFATE 300 (60 FE) MG/5ML PO SYRP: 300.0000 mg | ORAL_SOLUTION | Freq: Every day | ORAL | 3 refills | Status: DC

## 2016-05-27 MED ORDER — SODIUM CHLORIDE 0.9 % IV SOLN
510.0000 mg | Freq: Once | INTRAVENOUS | Status: AC
  Administered 2016-05-27: 510 mg via INTRAVENOUS
  Filled 2016-05-27: qty 17

## 2016-05-27 NOTE — Progress Notes (Signed)
Patient ambulating well in the hallway with no help. No complaints at this time. Will continue to monitor.

## 2016-05-27 NOTE — Discharge Summary (Signed)
Physician Discharge Summary  Donna Huff MRN: [id number] DOB/AGE: 03-01-1975 41 y.o.  PCP: [place] date: 05/26/2016 Discharge date: 05/27/2016  Discharge Diagnoses:    Principal Problem:   Syncope and collapse Active Problems:   Fibroids   Anemia   Thrombocytosis (HCC)   Facial laceration Iron deficiency anemia   Follow-up recommendations Follow-up with PCP in 3-5 days , including all  additional recommended appointments as below Follow-up CBC, CMP in 3-5 days Patient needs outpatient follow-up with gynecology for evaluation of menorrhagia (uterine fibroids) Patient would benefit from outpatient 2-D echo to complete syncope evaluation     Current Discharge Medication List    START taking these medications   Details  acetaminophen (TYLENOL) 500 MG tablet Take 1 tablet (500 mg total) by mouth every 6 (six) hours as needed. Qty: 30 tablet, Refills: 0    ferrous sulfate 300 (60 Fe) MG/5ML syrup Take 5 mLs (300 mg total) by mouth daily. Qty: 150 mL, Refills: 3    pantoprazole (PROTONIX) 40 MG tablet Take 1 tablet (40 mg total) by mouth daily. Qty: 30 tablet, Refills: 1      STOP taking these medications     ibuprofen (ADVIL,MOTRIN) 200 MG tablet          Discharge Condition: Stable   Discharge Instructions Get Medicines reviewed and adjusted: Please take all your medications with you for your next visit with your Primary MD  Please request your Primary MD to go over all hospital tests and procedure/radiological results at the follow up, please ask your Primary MD to get all Hospital records sent to his/her office.  If you experience worsening of your admission symptoms, develop shortness of breath, life threatening emergency, suicidal or homicidal thoughts you must seek medical attention immediately by calling 911 or calling your MD immediately if symptoms less severe.  You must read complete  instructions/literature along with all the possible adverse reactions/side effects for all the Medicines you take and that have been prescribed to you. Take any new Medicines after you have completely understood and accpet all the possible adverse reactions/side effects.   Do not drive when taking Pain medications.   Do not take more than prescribed Pain, Sleep and Anxiety Medications  Special Instructions: If you have smoked or chewed Tobacco in the last 2 yrs please stop smoking, stop any regular Alcohol and or any Recreational drug use.  Wear Seat belts while driving.  Please note  You were cared for by a hospitalist during your hospital stay. Once you are discharged, your primary care physician will handle any further medical issues. Please note that NO REFILLS for any discharge medications will be authorized once you are discharged, as it is imperative that you return to your primary care physician (or establish a relationship with a primary care physician if you do not have one) for your aftercare needs so that they can reassess your need for medications and monitor your lab values.        Consults:  None    Significant Diagnostic Studies:  X-ray Chest Pa And Lateral  Result Date: 05/26/2016 CLINICAL DATA:  Syncope, collapse EXAM: CHEST  2 VIEW COMPARISON:  None. FINDINGS: Heart and mediastinal contours are within normal limits. No focal opacities or effusions. No acute bony abnormality. IMPRESSION: No active cardiopulmonary disease. Electronically Signed   By: Rolm Baptise M.D.   On: 05/26/2016 12:38       Filed Weights  05/26/16 1001  Weight: 55.3 kg (122 lb)     Microbiology: No results found for this or any previous visit (from the past 240 hour(s)).     Blood Culture    Component Value Date/Time   SDES VAGINA 08/28/2006 0409   SPECREQUEST NONE 08/28/2006 0409   CULT  08/28/2006 0348    NO GROUP B STREP (S.AGALACTIAE) ISOLATED Multiple bacterial  morphotypes present, none predominant. Suggest appropriate recollection if clinically indicated.   REPTSTATUS 08/29/2006 FINAL 08/28/2006 0409      Labs: Results for orders placed or performed during the hospital encounter of 05/26/16 (from the past 48 hour(s))  Basic metabolic panel     Status: None   Collection Time: 05/26/16 10:12 AM  Result Value Ref Range   Sodium 135 135 - 145 mmol/L   Potassium 3.6 3.5 - 5.1 mmol/L   Chloride 103 101 - 111 mmol/L   CO2 25 22 - 32 mmol/L   Glucose, Bld 85 65 - 99 mg/dL   BUN 9 6 - 20 mg/dL   Creatinine, Ser 0.74 0.44 - 1.00 mg/dL   Calcium 9.4 8.9 - 10.3 mg/dL   GFR calc non Af Amer >60 >60 mL/min   GFR calc Af Amer >60 >60 mL/min    Comment: (NOTE) The eGFR has been calculated using the CKD EPI equation. This calculation has not been validated in all clinical situations. eGFR's persistently <60 mL/min signify possible Chronic Kidney Disease.    Anion gap 7 5 - 15  CBC     Status: Abnormal   Collection Time: 05/26/16 10:12 AM  Result Value Ref Range   WBC 9.5 4.0 - 10.5 K/uL   RBC 3.95 3.87 - 5.11 MIL/uL   Hemoglobin 6.9 (LL) 12.0 - 15.0 g/dL    Comment: REPEATED TO VERIFY CRITICAL RESULT CALLED TO, READ BACK BY AND VERIFIED WITH: A. MCKEOWN,RN [place]    HCT 24.5 (L) 36.0 - 46.0 %   MCV 62.0 (L) 78.0 - 100.0 fL   MCH 17.5 (L) 26.0 - 34.0 pg   MCHC 28.2 (L) 30.0 - 36.0 g/dL   RDW 19.8 (H) 11.5 - 15.5 %   Platelets 532 (H) 150 - 400 K/uL    Comment: PLATELET COUNT CONFIRMED BY SMEAR  Type and screen     Status: None   Collection Time: 05/26/16 10:55 AM  Result Value Ref Range   ABO/RH(D) A POS    Antibody Screen NEG    Sample Expiration 05/29/2016    Unit Number [id number]    Blood Component Type RED CELLS,LR    Unit division 00    Status of Unit ISSUED,FINAL    Transfusion Status OK TO TRANSFUSE    Crossmatch Result Compatible    Unit Number [id number]    Blood Component Type RED CELLS,LR     Unit division 00    Status of Unit ISSUED,FINAL    Transfusion Status OK TO TRANSFUSE    Crossmatch Result Compatible   ABO/Rh     Status: None   Collection Time: 05/26/16 10:55 AM  Result Value Ref Range   ABO/RH(D) A POS   Prepare RBC     Status: None   Collection Time: 05/26/16 11:41 AM  Result Value Ref Range   Order Confirmation ORDER PROCESSED BY BLOOD BANK   Vitamin B12     Status: None   Collection Time: 05/26/16  1:09 PM  Result Value Ref Range   Vitamin B-12 451 180 -  914 pg/mL    Comment: (NOTE) This assay is not validated for testing neonatal or myeloproliferative syndrome specimens for Vitamin B12 levels.   Folate     Status: None   Collection Time: 05/26/16  1:09 PM  Result Value Ref Range   Folate 7.7 >5.9 ng/mL  Iron and TIBC     Status: Abnormal   Collection Time: 05/26/16  1:09 PM  Result Value Ref Range   Iron 9 (L) 28 - 170 ug/dL   TIBC 487 (H) 250 - 450 ug/dL   Saturation Ratios 2 (L) 10.4 - 31.8 %   UIBC 478 ug/dL  Ferritin     Status: Abnormal   Collection Time: 05/26/16  1:09 PM  Result Value Ref Range   Ferritin 2 (L) 11 - 307 ng/mL  Reticulocytes     Status: None   Collection Time: 05/26/16  1:09 PM  Result Value Ref Range   Retic Ct Pct 1.2 0.4 - 3.1 %   RBC. 3.89 3.87 - 5.11 MIL/uL   Retic Count, Manual 46.7 19.0 - 186.0 K/uL  HIV antibody (Routine Testing)     Status: None   Collection Time: 05/26/16  1:09 PM  Result Value Ref Range   HIV Screen 4th Generation wRfx Non Reactive Non Reactive    Comment: (NOTE) Performed At: [facility] [address] [place], [place] [id number] Lindon Romp MD ZC:[id number]   TSH     Status: None   Collection Time: 05/26/16  1:09 PM  Result Value Ref Range   TSH 1.726 0.350 - 4.500 uIU/mL    Comment: Performed by a 3rd Generation assay with a functional sensitivity of <=0.01 uIU/mL.  I-Stat beta hCG blood, ED ([facility], [facility], [facility] only)     Status: None   Collection Time: 05/26/16  1:32 PM   Result Value Ref Range   I-stat hCG, quantitative <5.0 <5 mIU/mL   Comment 3            Comment:   GEST. AGE      CONC.  (mIU/mL)   <=1 WEEK        5 - 50     2 WEEKS       50 - 500     3 WEEKS       100 - 10,000     4 WEEKS     1,000 - 30,000        FEMALE AND NON-PREGNANT FEMALE:     LESS THAN 5 mIU/mL   I-stat troponin, ED     Status: None   Collection Time: 05/26/16  1:32 PM  Result Value Ref Range   Troponin i, poc 0.00 0.00 - 0.08 ng/mL   Comment 3            Comment: Due to the release kinetics of cTnI, a negative result within the first hours of the onset of symptoms does not rule out myocardial infarction with certainty. If myocardial infarction is still suspected, repeat the test at appropriate intervals.   Urinalysis, Routine w reflex microscopic     Status: Abnormal   Collection Time: 05/26/16  4:08 PM  Result Value Ref Range   Color, Urine YELLOW YELLOW   APPearance CLEAR CLEAR   Specific Gravity, Urine 1.014 1.005 - 1.030   pH 5.0 5.0 - 8.0   Glucose, UA NEGATIVE NEGATIVE mg/dL   Hgb urine dipstick MODERATE (A) NEGATIVE   Bilirubin Urine NEGATIVE NEGATIVE   Ketones, ur 5 (A)  NEGATIVE mg/dL   Protein, ur NEGATIVE NEGATIVE mg/dL   Nitrite NEGATIVE NEGATIVE   Leukocytes, UA NEGATIVE NEGATIVE   RBC / HPF 0-5 0 - 5 RBC/hpf   WBC, UA 0-5 0 - 5 WBC/hpf   Bacteria, UA NONE SEEN NONE SEEN   Squamous Epithelial / LPF 0-5 (A) NONE SEEN   Mucous PRESENT    Hyaline Casts, UA PRESENT   CBC     Status: Abnormal   Collection Time: 05/26/16  8:03 PM  Result Value Ref Range   WBC 8.5 4.0 - 10.5 K/uL   RBC 3.86 (L) 3.87 - 5.11 MIL/uL   Hemoglobin 7.3 (L) 12.0 - 15.0 g/dL   HCT 25.3 (L) 36.0 - 46.0 %   MCV 65.5 (L) 78.0 - 100.0 fL   MCH 18.9 (L) 26.0 - 34.0 pg   MCHC 28.9 (L) 30.0 - 36.0 g/dL   RDW 22.5 (H) 11.5 - 15.5 %   Platelets 469 (H) 150 - 400 K/uL  Glucose, capillary     Status: None   Collection Time: 05/27/16  5:50 AM  Result Value Ref Range    Glucose-Capillary 76 65 - 99 mg/dL  CBC     Status: Abnormal   Collection Time: 05/27/16  7:22 AM  Result Value Ref Range   WBC 7.3 4.0 - 10.5 K/uL   RBC 4.33 3.87 - 5.11 MIL/uL   Hemoglobin 8.8 (L) 12.0 - 15.0 g/dL   HCT 29.5 (L) 36.0 - 46.0 %   MCV 68.1 (L) 78.0 - 100.0 fL   MCH 20.3 (L) 26.0 - 34.0 pg   MCHC 29.8 (L) 30.0 - 36.0 g/dL   RDW 23.1 (H) 11.5 - 15.5 %   Platelets 454 (H) 150 - 400 K/uL     Lipid Panel     HPI :   41 y.o. female with medical history significant for uterine fibroids, anemia, migraines presents to emergency Department chief complaint of loss of consciousness. Initial evaluation reveals anemia likely related to blood loss and thrombocytosis  Information is obtained from the patient. She states over the last couple of weeks she has felt more tired than usual. This morning she awakened and she noticed she was having palpitations. She sat still for minute this subsided. She reports she has had intermittent episodes of the same over the last several weeks. She got up to go in the shower and had another episode of palpitations. Associated symptoms include lightheadedness and dizziness. She stepped out of the shower next memory is waking up on the floor. She noted there was bleeding from above her left eye. He denies having a difficulty getting up off the floor. She denies headache visual disturbances numbness tingling of extremities. She denies chest pain shortness of breath nausea vomiting diarrhea. She denies abdominal pain nausea vomiting diarrhea. She denies any constipation melena bright red blood per rectum. She denies dysuria hematuria frequency or urgency. She denies fever chills recent travel or sick contacts. She does report she has very have periods and has for many years. She states that she has only 1 period per month in the last 5-7 days. She is unaware that she's ever been diagnosed with uterine fibroids.    ED Course: Emergency department she's  afebrile hemodynamically stable and not hypoxic. She is provided with gentle IV fluids 2 units packed red blood cells were ordered   HOSPITAL COURSE: *  #1. Syncope and collapse. Likely related to anemia in setting of heavy menses. Patient with a  history of uterine fibroids. On admission Hemoglobin 6.9. Mild tachycardia. EKG with NSR. She is afebrile hemodynamically stable and nontoxic appearing. No home medications. No metabolic derangements. Neuro exam benign Telemetry shows normal sinus rhythm UA, chest x-ray negative TSH normal limits -FOBT not obtained -Echocardiogram  can be done as an outpatient Will need outpatient follow-up with PCP and gynecology   #2. Anemia likely related to blood loss secondary to heavy menses. 6.9 on admission  -Anemia panel-consistent with severe iron deficiency Transfuse with Feraheme Discontinued ibuprofen Started on PPI Started on iron supplementation MCV extremely low  #3.thrombocytosis. Likely secondary to iron deficiency anemia    #4. Uterine fibroids. Reports heavy flow with menstruation. Uses 1 pad every 2-3 hours. -OP follow up.  -case management for assistance with establishing with local PCP   #5. Facial laceration. Related to #1. Above left eyebrow. Related to fall -no bleeding, supportive care       Discharge Exam: *  Blood pressure 107/74, pulse 74, temperature 98.8 F (37.1 C), resp. rate 18, height 5' 3"  (1.6 m), weight 55.3 kg (122 lb), last menstrual period 05/21/2016, SpO2 100 %.    Cardiovascular:  RRR, no m/r/g. No LE edema. Pedal pulses present and palpable  Respiratory:  CTA bilaterally, no w/r/r. Normal respiratory effort.  Abdomen:  soft, ntnd, positive bowel sounds throughout no guarding or rebounding  Skin:  no rash or induration seen on limited exam  Musculoskeletal:  grossly normal tone BUE/BLE, good ROM, no bony abnormality  Psychiatric:  grossly normal mood and affect, speech fluent and appropriate,  AOx3  Neurologic:  CN 2-12 grossly intact, moves all extremities in coordinated fashion, sensation intact   Follow-up Information    [place]. Call.   Why:  Hospital follow-up in 3-5 days Follow CBC closely You will need to follow-up with outpatient gynecology Contact information: [place] [id number] [phone]           Signed: Reyne Dumas 05/27/2016, 10:45 AM        Time spent >45 mins

## 2016-05-27 NOTE — Progress Notes (Signed)
Patient was discharged home by MD order; discharged instructions  review and give to patient with care notes and prescriptions; IV DIC; patient left with her husband. She refused wheelchair.

## 2016-06-06 ENCOUNTER — Ambulatory Visit (HOSPITAL_COMMUNITY): Payer: Medicaid Other

## 2022-10-26 ENCOUNTER — Emergency Department (HOSPITAL_COMMUNITY)
Admission: EM | Admit: 2022-10-26 | Discharge: 2022-10-26 | Disposition: A | Discharge status: Home / Self Care | Payer: BC Managed Care – PPO | Attending: Emergency Medicine | Admitting: Emergency Medicine

## 2022-10-26 ENCOUNTER — Emergency Department (HOSPITAL_COMMUNITY): Payer: BC Managed Care – PPO

## 2022-10-26 ENCOUNTER — Encounter (HOSPITAL_COMMUNITY): Payer: Self-pay

## 2022-10-26 ENCOUNTER — Other Ambulatory Visit: Payer: Self-pay

## 2022-10-26 ENCOUNTER — Ambulatory Visit (HOSPITAL_COMMUNITY)
Admission: EM | Admit: 2022-10-26 | Discharge: 2022-10-26 | Disposition: A | Discharge status: Home / Self Care | Payer: BC Managed Care – PPO | Attending: Physician Assistant | Admitting: Physician Assistant

## 2022-10-26 DIAGNOSIS — R202 Paresthesia of skin: Secondary | ICD-10-CM | POA: Diagnosis not present

## 2022-10-26 DIAGNOSIS — D500 Iron deficiency anemia secondary to blood loss (chronic): Secondary | ICD-10-CM | POA: Diagnosis not present

## 2022-10-26 DIAGNOSIS — R200 Anesthesia of skin: Secondary | ICD-10-CM

## 2022-10-26 LAB — URINALYSIS, ROUTINE W REFLEX MICROSCOPIC
Bilirubin Urine: NEGATIVE
Glucose, UA: NEGATIVE mg/dL
Hgb urine dipstick: NEGATIVE
Ketones, ur: NEGATIVE mg/dL
Leukocytes,Ua: NEGATIVE
Nitrite: NEGATIVE
Protein, ur: NEGATIVE mg/dL
Specific Gravity, Urine: 1.001 — ABNORMAL LOW (ref 1.005–1.030)
pH: 6 (ref 5.0–8.0)

## 2022-10-26 LAB — CBC WITH DIFFERENTIAL/PLATELET
Abs Immature Granulocytes: 0.01 10*3/uL (ref 0.00–0.07)
Basophils Absolute: 0.1 10*3/uL (ref 0.0–0.1)
Basophils Relative: 1 %
Eosinophils Absolute: 0.2 10*3/uL (ref 0.0–0.5)
Eosinophils Relative: 4 %
HCT: 25.1 % — ABNORMAL LOW (ref 36.0–46.0)
Hemoglobin: 6.7 g/dL — CL (ref 12.0–15.0)
Immature Granulocytes: 0 %
Lymphocytes Relative: 19 %
Lymphs Abs: 0.8 10*3/uL (ref 0.7–4.0)
MCH: 16.9 pg — ABNORMAL LOW (ref 26.0–34.0)
MCHC: 26.7 g/dL — ABNORMAL LOW (ref 30.0–36.0)
MCV: 63.4 fL — ABNORMAL LOW (ref 80.0–100.0)
Monocytes Absolute: 0.5 10*3/uL (ref 0.1–1.0)
Monocytes Relative: 10 %
Neutro Abs: 2.8 10*3/uL (ref 1.7–7.7)
Neutrophils Relative %: 66 %
Platelets: 397 10*3/uL (ref 150–400)
RBC: 3.96 MIL/uL (ref 3.87–5.11)
RDW: 19.2 % — ABNORMAL HIGH (ref 11.5–15.5)
Smear Review: NORMAL
WBC: 4.4 10*3/uL (ref 4.0–10.5)
nRBC: 0 % (ref 0.0–0.2)

## 2022-10-26 LAB — COMPREHENSIVE METABOLIC PANEL
ALT: 16 U/L (ref 0–44)
AST: 42 U/L — ABNORMAL HIGH (ref 15–41)
Albumin: 3.9 g/dL (ref 3.5–5.0)
Alkaline Phosphatase: 41 U/L (ref 38–126)
Anion gap: 9 (ref 5–15)
BUN: 9 mg/dL (ref 6–20)
CO2: 24 mmol/L (ref 22–32)
Calcium: 9.2 mg/dL (ref 8.9–10.3)
Chloride: 101 mmol/L (ref 98–111)
Creatinine, Ser: 0.78 mg/dL (ref 0.44–1.00)
GFR, Estimated: >60 mL/min (ref >60)
Glucose, Bld: 103 mg/dL — ABNORMAL HIGH (ref 70–99)
Potassium: 3.5 mmol/L (ref 3.5–5.1)
Sodium: 134 mmol/L — ABNORMAL LOW (ref 135–145)
Total Bilirubin: 0.5 mg/dL (ref 0.3–1.2)
Total Protein: 8.3 g/dL — ABNORMAL HIGH (ref 6.5–8.1)

## 2022-10-26 LAB — POC OCCULT BLOOD, ED: Fecal Occult Bld: NEGATIVE

## 2022-10-26 MED ORDER — IOHEXOL 350 MG/ML SOLN
75.0000 mL | Freq: Once | INTRAVENOUS | Status: AC | PRN
  Administered 2022-10-26: 75 mL via INTRAVENOUS

## 2022-10-26 MED ORDER — SODIUM CHLORIDE 0.9% IV SOLUTION: Freq: Once | INTRAVENOUS | Status: DC

## 2022-10-26 MED ORDER — HYDROMORPHONE HCL 1 MG/ML IJ SOLN: 1.0000 mg | Freq: Once | INTRAMUSCULAR | Status: DC

## 2022-10-26 MED ORDER — FERROUS SULFATE 325 (65 FE) MG PO TABS
325.0000 mg | ORAL_TABLET | Freq: Two times a day (BID) | ORAL | 1 refills | Status: AC
  Filled 2022-10-26: qty 60, 30d supply, fill #0

## 2022-10-26 NOTE — ED Triage Notes (Signed)
Pt arrived POV from home c/o waking up around 2am and having a swishing sound in her left ear finally went back to sleep and woke up around 8am and has a tingling sensation on the left side of her face and on the back of her neck.

## 2022-10-26 NOTE — ED Notes (Signed)
Patient transported to CT 

## 2022-10-26 NOTE — ED Provider Notes (Signed)
[facility]    CSN: [id number] Arrival date & time: 10/26/22  1059      History   Chief Complaint Chief Complaint  Patient presents with   Ear Pain    HPI Donna Huff is a 47 y.o. female.   Patient here today for evaluation of left ear discomfort and left facial numbness that started this morning around 2 to 3 AM.  She reports that she can hear the pulsation and blood flow in her left ear.  She has had this happen in the past but no facial numbness.  She also has history of symptomatic anemia.  She denies any chest pain or shortness of breath.  She has not had headache.  Blood pressure is initially elevated in office.  The history is provided by the patient.    Past Medical History:  Diagnosis Date   Anemia    Anxiety    Childhood asthma    Depression    Fibroids    Headache    "weekly" (05/26/2016)   History of blood transfusion 05/26/2016   "this is my first" (4;/10/2016)   Migraine    "several/year" (05/26/2016)   PONV (postoperative nausea and vomiting)    "& wake up cray, took alot of time to wake me up"(05/26/2016)   Syncope and collapse    Thrombocytosis     Patient Active Problem List   Diagnosis Date Noted   Thrombocytosis 05/26/2016   Syncope 05/26/2016   Facial laceration 05/26/2016   Syncope and collapse    Fibroids    Anemia    Abrasion of left eyebrow    LOC (loss of consciousness) (HCC)    Menorrhagia with regular cycle    Symptomatic anemia     Past Surgical History:  Procedure Laterality Date   DILATION AND EVACUATION  1991; 2001   LAPAROSCOPIC CHOLECYSTECTOMY  09/2012   WISDOM TOOTH EXTRACTION  2002    OB History   No obstetric history on file.      Home Medications    Prior to Admission medications   Medication Sig Start Date End Date Taking? Authorizing Provider  acetaminophen (TYLENOL) 500 MG tablet Take 1 tablet (500 mg total) by mouth every 6 (six) hours as needed. 05/27/16  Yes Richarda Overlie, MD  ferrous  sulfate 300 (60 Fe) MG/5ML syrup Take 5 mLs (300 mg total) by mouth daily. 05/27/16  Yes Richarda Overlie, MD  pantoprazole (PROTONIX) 40 MG tablet Take 1 tablet (40 mg total) by mouth daily. 05/27/16  Yes Richarda Overlie, MD    Family History Family History  Problem Relation Age of Onset   Hypertension Mother     Social History Social History   Tobacco Use   Smoking status: Never   Smokeless tobacco: Never  Substance Use Topics   Alcohol use: No   Drug use: No     Allergies   Patient has no known allergies.   Review of Systems Review of Systems  Constitutional:  Negative for chills and fever.  Eyes:  Negative for discharge and redness.  Respiratory:  Negative for shortness of breath.   Gastrointestinal:  Negative for abdominal pain, nausea and vomiting.  Neurological:  Positive for numbness. Negative for headaches.     Physical Exam Triage Vital Signs ED Triage Vitals  Encounter Vitals Group     BP      Systolic BP Percentile      Diastolic BP Percentile      Pulse  Resp      Temp      Temp src      SpO2      Weight      Height      Head Circumference      Peak Flow      Pain Score      Pain Loc      Pain Education      Exclude from Growth Chart    No data found.  Updated Vital Signs BP (!) 150/89   Pulse 98   Temp 98.4 F (36.9 C) (Oral)   Resp 18   LMP 10/13/2022 (Approximate)   SpO2 99%    Physical Exam Vitals and nursing note reviewed.  Constitutional:      General: She is not in acute distress.    Appearance: Normal appearance. She is not ill-appearing.  HENT:     Head: Normocephalic and atraumatic.     Right Ear: Tympanic membrane and ear canal normal.     Left Ear: Tympanic membrane and ear canal normal.     Nose: Nose normal. No congestion or rhinorrhea.  Eyes:     Conjunctiva/sclera: Conjunctivae normal.  Cardiovascular:     Rate and Rhythm: Normal rate.  Pulmonary:     Effort: Pulmonary effort is normal. No respiratory  distress.  Neurological:     Mental Status: She is alert.  Psychiatric:        Mood and Affect: Mood normal.        Behavior: Behavior normal.        Thought Content: Thought content normal.      UC Treatments / Results  Labs (all labs ordered are listed, but only abnormal results are displayed) Labs Reviewed - No data to display  EKG   Radiology No results found.  Procedures Procedures (including critical care time)  Medications Ordered in UC Medications - No data to display  Initial Impression / Assessment and Plan / UC Course  I have reviewed the triage vital signs and the nursing notes.  Pertinent labs & imaging results that were available during my care of the patient were reviewed by me and considered in my medical decision making (see chart for details).   Given reported facial numbness recommended further evaluation in the emergency room.  Patient is agreeable and will transport via POV.  Blood pressure was initially elevated but did improve somewhat which is promising.   Final Clinical Impressions(s) / UC Diagnoses   Final diagnoses:  Left facial numbness   Discharge Instructions   None    ED Prescriptions   None    PDMP not reviewed this encounter.   Tomi Bamberger, PA-C 10/26/22 1250

## 2022-10-26 NOTE — ED Triage Notes (Signed)
Pt presents to the office for left ear pain and pressure that started this morning.

## 2022-10-26 NOTE — Discharge Instructions (Signed)
We evaluated you for your tingling sensation and ear fullness.  Your examination was reassuring, and your CT scan was negative for any sign of an aneurysm or tumor, so I do not think your symptoms are due to a dangerous condition.  I did not see any sign of an ear infection on your ear examination, and your symptoms are already improving.  I believe that your symptoms will likely go away on their own.  We also noticed that you were very anemic.  This is probably due to low iron levels from heavy menstrual bleeding.  I have prescribed you iron supplementation, please take this twice a day.  We also gave you a blood transfusion in the emergency department.  Please schedule a follow-up appointment with a gynecologist.  You can call [facility] to establish care.  Please also establish care with a primary doctor, please call the phone number on the discharge paperwork so that you can be connected with a primary doctor.  I would have your laboratory test checked again in 1 week.  You can go to an urgent care if you do not have a primary doctor yet.  If you have any new or worsening symptoms such as lightheadedness or dizziness, weakness on one side of your body, facial droop, trouble swallowing or trouble speaking, numbness, trouble walking, lightheadedness or dizziness, fainting, chest pain or shortness of breath, leg swelling, or any other new symptoms, please return to the emergency department for reassessment.

## 2022-10-26 NOTE — ED Notes (Signed)
Awaiting patient from the lobby

## 2022-10-26 NOTE — ED Provider Notes (Signed)
[place] [facility] AT [facility] Provider Note  CSN: [id number] Arrival date & time: 10/26/22 1258  Chief Complaint(s) Numbness  HPI Donna Huff is a 47 y.o. female history of fibroids, prior blood transfusion due to fibroids presenting with tingling.  Patient reports numbness to left side of her face.  She also reports sensation of ear fullness, had a whooshing sound in her ear.  The symptoms have improved.  She initially went to urgent care where they advised she should come to the emergency department.  Otherwise reports that she is having ongoing menstrual bleeding which is regular during menstrual cycles.  Has not noticed any black or tarry stools.  No lightheadedness, dizziness, syncope, chest pain, shortness of breath.  No weakness, numbness.  No fevers or chills.  No neck pain or stiffness.  No headaches.   Past Medical History Past Medical History:  Diagnosis Date   Anemia    Anxiety    Childhood asthma    Depression    Fibroids    Headache    "weekly" (05/26/2016)   History of blood transfusion 05/26/2016   "this is my first" (4;/10/2016)   Migraine    "several/year" (05/26/2016)   PONV (postoperative nausea and vomiting)    "& wake up cray, took alot of time to wake me up"(05/26/2016)   Syncope and collapse    Thrombocytosis    Patient Active Problem List   Diagnosis Date Noted   Thrombocytosis 05/26/2016   Syncope 05/26/2016   Facial laceration 05/26/2016   Syncope and collapse    Fibroids    Anemia    Abrasion of left eyebrow    LOC (loss of consciousness) (HCC)    Menorrhagia with regular cycle    Symptomatic anemia    Home Medication(s) Prior to Admission medications   Medication Sig Start Date End Date Taking? Authorizing Provider  ferrous sulfate 325 (65 FE) MG tablet Take 1 tablet (325 mg total) by mouth 2 (two) times daily with a meal. 10/26/22  Yes Lonell Grandchild, MD  acetaminophen (TYLENOL) 500 MG tablet Take 1 tablet  (500 mg total) by mouth every 6 (six) hours as needed. 05/27/16   Richarda Overlie, MD  pantoprazole (PROTONIX) 40 MG tablet Take 1 tablet (40 mg total) by mouth daily. 05/27/16   Richarda Overlie, MD                                                                                                                                    Past Surgical History Past Surgical History:  Procedure Laterality Date   DILATION AND EVACUATION  1991; 2001   LAPAROSCOPIC CHOLECYSTECTOMY  09/2012   WISDOM TOOTH EXTRACTION  2002   Family History Family History  Problem Relation Age of Onset   Hypertension Mother     Social History Social History   Tobacco Use   Smoking status: Never   Smokeless tobacco:  Never  Substance Use Topics   Alcohol use: No   Drug use: No   Allergies Patient has no known allergies.  Review of Systems Review of Systems  All other systems reviewed and are negative.   Physical Exam Vital Signs  I have reviewed the triage vital signs BP 139/78   Pulse 81   Temp 98.2 F (36.8 C) (Oral)   Resp 17   Ht 5' 2.5" (1.588 m)   LMP 10/13/2022 (Approximate)   SpO2 100%   BMI 21.96 kg/m  Physical Exam Vitals and nursing note reviewed.  Constitutional:      General: She is not in acute distress.    Appearance: She is well-developed.  HENT:     Head: Normocephalic and atraumatic.     Right Ear: Tympanic membrane normal.     Left Ear: Tympanic membrane normal.     Mouth/Throat:     Mouth: Mucous membranes are moist.  Eyes:     Pupils: Pupils are equal, round, and reactive to light.  Cardiovascular:     Rate and Rhythm: Normal rate and regular rhythm.     Heart sounds: No murmur heard. Pulmonary:     Effort: Pulmonary effort is normal. No respiratory distress.     Breath sounds: Normal breath sounds.  Abdominal:     General: Abdomen is flat.     Palpations: Abdomen is soft.     Tenderness: There is no abdominal tenderness.  Musculoskeletal:        General: No  tenderness.     Right lower leg: No edema.     Left lower leg: No edema.  Skin:    General: Skin is warm and dry.  Neurological:     General: No focal deficit present.     Mental Status: She is alert. Mental status is at baseline.     Comments: Cranial nerves II through XII intact, strength 5 out of 5 in the bilateral upper and lower extremities, no sensory deficit to light touch, no dysmetria on finger-nose-finger testing  Psychiatric:        Mood and Affect: Mood normal.        Behavior: Behavior normal.     ED Results and Treatments Labs (all labs ordered are listed, but only abnormal results are displayed) Labs Reviewed  CBC WITH DIFFERENTIAL/PLATELET - Abnormal; Notable for the following components:      Result Value   Hemoglobin 6.7 (*)    HCT 25.1 (*)    MCV 63.4 (*)    MCH 16.9 (*)    MCHC 26.7 (*)    RDW 19.2 (*)    All other components within normal limits  COMPREHENSIVE METABOLIC PANEL - Abnormal; Notable for the following components:   Sodium 134 (*)    Glucose, Bld 103 (*)    Total Protein 8.3 (*)    AST 42 (*)    All other components within normal limits  URINALYSIS, ROUTINE W REFLEX MICROSCOPIC - Abnormal; Notable for the following components:   Color, Urine COLORLESS (*)    Specific Gravity, Urine 1.001 (*)    All other components within normal limits  POC OCCULT BLOOD, ED  TYPE AND SCREEN  PREPARE RBC (CROSSMATCH)  Radiology CT ANGIO HEAD NECK W WO CM  Result Date: 10/26/2022 CLINICAL DATA:  Tinnitus. Patient describes a swishing sound in her left ear. Tingling sensation in the left side of the face. She has had similar ear symptoms in the past. EXAM: CT ANGIOGRAPHY HEAD AND NECK WITH AND WITHOUT CONTRAST TECHNIQUE: Multidetector CT imaging of the head and neck was performed using the standard protocol during bolus administration of  intravenous contrast. Multiplanar CT image reconstructions and MIPs were obtained to evaluate the vascular anatomy. Carotid stenosis measurements (when applicable) are obtained utilizing NASCET criteria, using the distal internal carotid diameter as the denominator. RADIATION DOSE REDUCTION: This exam was performed according to the departmental dose-optimization program which includes automated exposure control, adjustment of the mA and/or kV according to patient size and/or use of iterative reconstruction technique. CONTRAST:  75mL OMNIPAQUE IOHEXOL 350 MG/ML SOLN COMPARISON:  None Available. FINDINGS: CT HEAD FINDINGS Brain: No acute infarct, hemorrhage, or mass lesion is present. No significant white matter lesions are present. Deep brain nuclei are within normal limits. The ventricles are of normal size. No significant extraaxial fluid collection is present. The brainstem and cerebellum are within normal limits. Midline structures are within normal limits. Vascular: No hyperdense vessel or unexpected calcification. Skull: Calvarium is intact. No focal lytic or blastic lesions are present. No significant extracranial soft tissue lesion is present. Sinuses/Orbits: The paranasal sinuses and mastoid air cells are clear. The globes and orbits are within normal limits. Review of the MIP images confirms the above findings CTA NECK FINDINGS Aortic arch: A 3 vessel arch configuration is present. No significant atherosclerotic changes present. No stenosis or aneurysm is present. Right carotid system: The right common carotid artery is normal. The bifurcation is unremarkable. The cervical right ICA is normal. Left carotid system: The left common carotid artery is within normal limits. Bifurcation is unremarkable. Cervical left ICA is. Vertebral arteries: Left vertebral artery is dominant vessel. Both vertebral arteries originate from the subclavian arteries without significant stenosis. No significant stenosis is present  in either vertebral artery in the neck. Skeleton: Degenerative endplate changes are present C5-6. Vertebral body heights and alignment otherwise normal. No focal osseous lesions are present. Other neck: Soft tissues the neck are otherwise unremarkable. Salivary glands are within normal limits. Thyroid is normal. No significant adenopathy is present. No focal mucosal or submucosal lesions are present. Upper chest: The lung apices are clear. The thoracic inlet is within normal limits. Review of the MIP images confirms the above findings CTA HEAD FINDINGS Anterior circulation: The internal carotid arteries are within normal limits from the skull base to the ICA termini. No aberrant artery is present. The A1 and M1 segments normal. The MCA bifurcations are normal. The ACA and MCA branch vessels are normal. No aneurysm is present. Posterior circulation: The vertebral arteries are hypoplastic bilaterally. The basilar artery is small. The superior cerebellar arteries patent bilaterally. A small P1 segment is present the left. Fetal type posterior cerebral arteries are present bilaterally. The PCA branch vessels are normal bilaterally. Venous sinuses: The dural sinuses are patent. The straight sinus and deep cerebral veins are intact. Cortical veins are within normal limits. No significant vascular malformation is evident. Anatomic variants: Fetal type posterior cerebral arteries bilaterally. Review of the MIP images confirms the above findings IMPRESSION: 1. Normal variant CTA Circle of Willis without significant proximal stenosis, aneurysm, or branch vessel occlusion. No acute or focal abnormality to explain tinnitus 2. Fetal type posterior cerebral arteries bilaterally. 3. Normal  CTA of the neck. 4. Normal noncontrast CT of the head. Electronically Signed   By: Marin Roberts M.D.   On: 10/26/2022 17:23    Pertinent labs & imaging results that were available during my care of the patient were reviewed by me and  considered in my medical decision making (see MDM for details).  Medications Ordered in ED Medications  0.9 %  sodium chloride infusion (Manually program via Guardrails IV Fluids) (0 mLs Intravenous Hold 10/26/22 1600)  iohexol (OMNIPAQUE) 350 MG/ML injection 75 mL (75 mLs Intravenous Contrast Given 10/26/22 1630)                                                                                                                                     Procedures Procedures  (including critical care time)  Medical Decision Making / ED Course   MDM:  47 year old presenting to the emergency department with whooshing sound/left face tingling.  Examination overall reassuring, no neurologic abnormality appreciated.  TM normal bilaterally.  Obtain CT angiography which shows no evidence of aneurysm, dissection, intracranial tumor or mass.  Very low concern for stroke or other dangerous process.  Suspect likely ear related.  She also reported sensation of ear fullness  Patient also anemic.  Reviewing chart, she has had similar anemia previously in 2018 received blood transfusion.  She reports history of heavy menstrual bleeding which is unchanged.  Denies any rectal bleeding, melena, hemoptysis or other blood loss.  She has a microcytic anemia consistent with iron depletion.  Will give unit of blood here.  Patient consented.  Obtained rectal exam, chaperoned by nursing with brown stool, will send for occult blood testing.  If negative, patient should be stable for discharge and follow-up with OB in primary doctor.  She has no real symptoms of anemia other than some mild fatigue and her vital signs are reassuring.  Clinical Course as of 10/26/22 2006  Sun Oct 26, 2022  2003 Patient received transfusion.  Occult blood is negative.  Patient feels better.  Discussed need for gynecology and primary doctor follow-up.  Will prescribe iron supplementation.  She reports that the whooshing sound in her ear is almost  resolved at this point.  Low concern for dangerous cause with reassuring imaging and exam.  Advised recheck of labs in around 1 week, if she has not had a primary doctor visit yet she could go to urgent care to have a repeat CBC drawn. Will discharge patient to home. All questions answered. Patient comfortable with plan of discharge. Return precautions discussed with patient and specified on the after visit summary.  [WS]    Clinical Course User Index [WS] Lonell Grandchild, MD     Additional history obtained:  -External records from outside source obtained and reviewed including: Chart review including previous notes, labs, imaging, consultation notes including prior admission for anemia   Lab Tests: -I ordered, reviewed, and interpreted labs.  The pertinent results include:   Labs Reviewed  CBC WITH DIFFERENTIAL/PLATELET - Abnormal; Notable for the following components:      Result Value   Hemoglobin 6.7 (*)    HCT 25.1 (*)    MCV 63.4 (*)    MCH 16.9 (*)    MCHC 26.7 (*)    RDW 19.2 (*)    All other components within normal limits  COMPREHENSIVE METABOLIC PANEL - Abnormal; Notable for the following components:   Sodium 134 (*)    Glucose, Bld 103 (*)    Total Protein 8.3 (*)    AST 42 (*)    All other components within normal limits  URINALYSIS, ROUTINE W REFLEX MICROSCOPIC - Abnormal; Notable for the following components:   Color, Urine COLORLESS (*)    Specific Gravity, Urine 1.001 (*)    All other components within normal limits  POC OCCULT BLOOD, ED  TYPE AND SCREEN  PREPARE RBC (CROSSMATCH)    Notable for microcytic anemia  EKG   EKG Interpretation Date/Time:  Sunday October 26 2022 14:40:54 EDT Ventricular Rate:  78 PR Interval:  138 QRS Duration:  90 QT Interval:  386 QTC Calculation: 440 R Axis:   73  Text Interpretation: Sinus rhythm Abnormal R-wave progression, early transition Minimal ST depression, inferior leads Confirmed by Alvino Blood  [phone]) on 10/26/2022 3:43:39 PM         Imaging Studies ordered: I ordered imaging studies including CTA head and neck On my interpretation imaging demonstrates no acute process I independently visualized and interpreted imaging. I agree with the radiologist interpretation   Medicines ordered and prescription drug management: Meds ordered this encounter  Medications   0.9 %  sodium chloride infusion (Manually program via Guardrails IV Fluids)   iohexol (OMNIPAQUE) 350 MG/ML injection 75 mL   DISCONTD: HYDROmorphone (DILAUDID) injection 1 mg   ferrous sulfate 325 (65 FE) MG tablet    Sig: Take 1 tablet (325 mg total) by mouth 2 (two) times daily with a meal.    Dispense:  60 tablet    Refill:  1    -I have reviewed the patients home medicines and have made adjustments as needed   Cardiac Monitoring: The patient was maintained on a cardiac monitor.  I personally viewed and interpreted the cardiac monitored which showed an underlying rhythm of: NSR  Reevaluation: After the interventions noted above, I reevaluated the patient and found that their symptoms have improved  Co morbidities that complicate the patient evaluation  Past Medical History:  Diagnosis Date   Anemia    Anxiety    Childhood asthma    Depression    Fibroids    Headache    "weekly" (05/26/2016)   History of blood transfusion 05/26/2016   "this is my first" (4;/10/2016)   Migraine    "several/year" (05/26/2016)   PONV (postoperative nausea and vomiting)    "& wake up cray, took alot of time to wake me up"(05/26/2016)   Syncope and collapse    Thrombocytosis       Dispostion: Disposition decision including need for hospitalization was considered, and patient discharged from emergency department.    Final Clinical Impression(s) / ED Diagnoses Final diagnoses:  Paresthesias  Iron deficiency anemia due to chronic blood loss     This chart was dictated using voice recognition software.  Despite best  efforts to proofread,  errors can occur which can change the documentation meaning.    Lonell Grandchild, MD 10/26/22 2006

## 2022-10-26 NOTE — ED Notes (Signed)
Electronic consent signed by patient and witnessed by this RN

## 2022-10-26 NOTE — ED Provider Triage Note (Addendum)
Emergency Medicine Provider Triage Evaluation Note  Donna Huff , a 47 y.o. female  was evaluated in triage.  Pt complains of swishing sound in her left ear since 2 AM today.  She went back to sleep and woke up around 8 AM and had a tingling sensation on left side of her face.  Denies any extremity weakness or numbness. Denies fever, nausea and vomiting.  She denies any headache.  She has a history of symptomatic anemia and has similar symptoms before but no facial numbness.  Review of Systems  Positive: As above Negative: As above  Physical Exam  BP (!) 158/98 (BP Location: Right Arm)   Pulse 89   Temp 99.2 F (37.3 C) (Oral)   Resp 16   Ht 5' 2.5" (1.588 m)   LMP 10/13/2022 (Approximate)   SpO2 100%   BMI 21.96 kg/m  Gen:   Awake, no distress   Resp:  Normal effort  MSK:   Moves extremities without difficulty  Other:    Medical Decision Making  Medically screening exam initiated at 1:28 PM.  Appropriate orders placed.  Donna Huff was informed that the remainder of the evaluation will be completed by another provider, this initial triage assessment does not replace that evaluation, and the importance of remaining in the ED until their evaluation is complete.    Donna Malling, PA 10/26/22 1330    Donna Huff, [place] 10/26/22 1330

## 2022-10-26 NOTE — ED Notes (Signed)
Patient is being discharged from the Urgent Care and sent to the Emergency Department via POV . Per Erma Pinto, PA, patient is in need of higher level of care due to left facial numbness. Patient is aware and verbalizes understanding of plan of care.  Vitals:   10/26/22 1217 10/26/22 1239  BP: (!) 169/103 (!) 150/89  Pulse: 98 98  Resp: 18 18  Temp: 98.4 F (36.9 C) 98.4 F (36.9 C)  SpO2: 98% 99%

## 2022-10-27 ENCOUNTER — Other Ambulatory Visit (HOSPITAL_COMMUNITY): Payer: Self-pay

## 2022-10-27 LAB — BPAM RBC
Blood Product Expiration Date: 202409142359
ISSUE DATE / TIME: 202409081724
Unit Type and Rh: 6200

## 2022-10-27 LAB — TYPE AND SCREEN
ABO/RH(D): A POS
Antibody Screen: NEGATIVE
Unit division: 0

## 2022-10-27 LAB — PREPARE RBC (CROSSMATCH)
